# Patient Record
Sex: Female | Born: 1995 | Race: Black or African American | Hispanic: No | Marital: Single | Smoking: Current every day smoker
Health system: Southern US, Community
[De-identification: ages and names within clinical notes are randomized; demographics above are authoritative.]

## PROBLEM LIST (undated history)

## (undated) DIAGNOSIS — J45909 Unspecified asthma, uncomplicated: Secondary | ICD-10-CM

## (undated) DIAGNOSIS — R87619 Unspecified abnormal cytological findings in specimens from cervix uteri: Secondary | ICD-10-CM

## (undated) HISTORY — DX: Unspecified abnormal cytological findings in specimens from cervix uteri: R87.619

---
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
## 2014-01-01 LAB — URINE CULTURE: Colony Count: >=100000

## 2014-01-02 ENCOUNTER — Telehealth (HOSPITAL_BASED_OUTPATIENT_CLINIC_OR_DEPARTMENT_OTHER): Payer: Self-pay

## 2014-01-02 NOTE — Progress Notes (Signed)
ED Antimicrobial Stewardship Positive Culture Follow Up   Mindi JunkerBrittany A Schwark is an 18 y.o. female who [facility] with a chief complaint of  Chief Complaint  Patient presents with  . Back Pain    Recent Results (from the past 720 hour(s))  URINE CULTURE     Status: None   Collection Time    12/28/13  6:20 AM      Result Value Ref Range Status   Specimen Description URINE, CLEAN CATCH   Final   Special Requests None   Final   Culture  Setup Time     Final   Value: 12/28/2013 08:02     Performed at [facility]   Colony Count     Final   Value: >=100,000 COLONIES/ML     Performed at [facility]   Culture     Final   Value: STAPHYLOCOCCUS SPECIES (COAGULASE NEGATIVE)     Note: RIFAMPIN AND GENTAMICIN SHOULD NOT BE USED AS SINGLE DRUGS FOR TREATMENT OF STAPH INFECTIONS.     Performed at [facility]   Report Status 01/01/2014 FINAL   Final   Organism ID, Bacteria STAPHYLOCOCCUS SPECIES (COAGULASE NEGATIVE)   Final    [x]  Treated with cephalexin, organism resistant to prescribed antimicrobial []  Patient discharged originally without antimicrobial agent and treatment is now indicated  New antibiotic prescription: macrobid 100mg  po BID x 5 days  ED Provider: Emilia BeckKaitlyn Szekalski PA-C   Garth BignessJeremy [facility] 01/02/2014, 10:37 AM Infectious Diseases Pharmacist Phone# [phone]

## 2014-01-02 NOTE — Telephone Encounter (Unsigned)
Post ED Visit - Positive Culture Follow-up: Successful Patient Follow-Up  Culture assessed and recommendations reviewed by: []  Wes Dulaney, Pharm.D., BCPS [x]  Celedonio MiyamotoJeremy Frens, Pharm.D., BCPS []  Georgina PillionElizabeth Martin, Pharm.D., BCPS []  DamascusMinh Pham, [address] Rainbow BoulevardPharm.D., BCPS, AAHIVP []  Estella HuskMichelle Turner, Pharm.D., BCPS, AAHIVP  Positive Urine culture, >/= 100,000 colonies -> Staph. species  []  Patient discharged without antimicrobial prescription and treatment is now indicated [x]  Organism is resistant to prescribed ED discharge antimicrobial, Cephalexin []  Patient with positive blood cultures  Changes discussed with ED provider: Everlean PattersonK. Szekalski PA New antibiotic prescription "Macrobid 100 mg po BID x 5 days"Called to *** Contacted patient, date 01/02/14, time 11:58 [facility] [phone]   Arvid Rightatricia Dorn Jeffery Bachmeier 01/02/2014, 12:00 PM

## 2014-06-25 ENCOUNTER — Encounter (HOSPITAL_COMMUNITY): Payer: Self-pay | Admitting: Emergency Medicine

## 2018-08-20 ENCOUNTER — Encounter (HOSPITAL_COMMUNITY): Payer: Self-pay

## 2018-08-20 ENCOUNTER — Ambulatory Visit (HOSPITAL_COMMUNITY)
Admission: EM | Admit: 2018-08-20 | Discharge: 2018-08-20 | Disposition: A | Discharge status: Home / Self Care | Payer: Medicaid Other | Attending: Family Medicine | Admitting: Family Medicine

## 2018-08-20 ENCOUNTER — Other Ambulatory Visit: Payer: Self-pay

## 2018-08-20 DIAGNOSIS — J309 Allergic rhinitis, unspecified: Secondary | ICD-10-CM | POA: Insufficient documentation

## 2018-08-20 DIAGNOSIS — J45909 Unspecified asthma, uncomplicated: Secondary | ICD-10-CM | POA: Insufficient documentation

## 2018-08-20 DIAGNOSIS — R0981 Nasal congestion: Secondary | ICD-10-CM | POA: Insufficient documentation

## 2018-08-20 MED ORDER — MONTELUKAST SODIUM 10 MG PO TABS: 10.0000 mg | ORAL_TABLET | Freq: Every day | ORAL | 0 refills | Status: DC

## 2018-08-20 MED ORDER — CETIRIZINE HCL 10 MG PO TABS: 10.0000 mg | ORAL_TABLET | Freq: Every day | ORAL | 0 refills | Status: DC

## 2018-08-20 MED ORDER — ALBUTEROL SULFATE HFA 108 (90 BASE) MCG/ACT IN AERS: 2.0000 | INHALATION_SPRAY | Freq: Four times a day (QID) | RESPIRATORY_TRACT | 0 refills | Status: AC | PRN

## 2018-08-20 NOTE — ED Provider Notes (Signed)
[facility]    CSN: [id number] Arrival date & time: 08/20/18  1412     History   Chief Complaint Chief Complaint  Patient presents with  . Nasal Congestion    HPI Sara Gill is a 22 y.o. female.  Nasal congestion: C/O nasal congestion for 3-4 days, associated with reduced taste sensation. No fever but she felt warm yesterday. Stuffy and runny nose, the color of her nasal discharge is yellow or clear, no blood in it. She endorsed postnasal drip which irritates her throat. She coughs some. She works at [employer] in the [employer] department. She had not been to work in 2 days. Asthma: She stated that she has been wheezing a lot more lately and had been using her inhaler more in the last two days. She requested refill of her med.  The history is provided by the patient.    Past Medical History:  Diagnosis Date  . Asthma     There are no active problems to display for this patient.   History reviewed. No pertinent surgical history.  OB History    Gravida  1   Para      Term      Preterm      AB      Living        SAB      TAB      Ectopic      Multiple      Live Births               Home Medications    Prior to Admission medications   Medication Sig Start Date End Date Taking? Authorizing Provider  acetaminophen (TYLENOL) 500 MG tablet Take 1,000 mg by mouth every 6 (six) hours as needed for moderate pain.    [provider]  albuterol (PROVENTIL HFA;VENTOLIN HFA) 108 (90 BASE) MCG/ACT inhaler Inhale 2 puffs into the lungs every 6 (six) hours as needed. Shortness of breath/wheezing    [provider]  albuterol (PROVENTIL) (2.5 MG/3ML) 0.083% nebulizer solution Take 2.5 mg by nebulization every 6 (six) hours as needed. Shortness of breath/wheezing    [provider]  cephALEXin (KEFLEX) 500 MG capsule Take 1 capsule (500 mg total) by mouth 4 (four) times daily. 12/28/13   Renne CriglerGeiple, Joshua, PA-C  Prenatal Vit-Fe  Fumarate-FA (PRENATAL MULTIVITAMIN) TABS tablet Take 1 tablet by mouth daily at 12 noon.    [provider]    Family History History reviewed. No pertinent family history.  Social History Social History   Tobacco Use  . Smoking status: Never Smoker  . Smokeless tobacco: Never Used  Substance Use Topics  . Alcohol use: No  . Drug use: No     Allergies   Patient has no known allergies.   Review of Systems Review of Systems  HENT: Positive for congestion, postnasal drip and rhinorrhea. Negative for ear discharge, ear pain, nosebleeds, sinus pressure, sinus pain, sneezing and trouble swallowing.   Respiratory: Negative.   Cardiovascular: Negative.   Gastrointestinal: Negative.   Neurological: Negative.   All other systems reviewed and are negative.    Physical Exam Triage Vital Signs ED Triage Vitals  Enc Vitals Group     BP 08/20/18 1458 139/62     Pulse Rate 08/20/18 1458 64     Resp 08/20/18 1458 18     Temp 08/20/18 1458 97.8 F (36.6 C)     Temp src --  SpO2 08/20/18 1458 99 %     Weight 08/20/18 1455 180 lb (81.6 kg)     Height --      Head Circumference --      Peak Flow --      Pain Score 08/20/18 1455 6     Pain Loc --      Pain Edu? --      Excl. in GC? --    No data found.  Updated Vital Signs BP 139/62 (BP Location: Right Arm)   Pulse 64   Temp 97.8 F (36.6 C)   Resp 18   Wt 81.6 kg   LMP 08/18/2018   SpO2 99%   BMI 28.19 kg/m   Visual Acuity Right Eye Distance:   Left Eye Distance:   Bilateral Distance:    Right Eye Near:   Left Eye Near:    Bilateral Near:     Physical Exam Vitals signs and nursing note reviewed.  Constitutional:      General: She is not in acute distress.    Appearance: Normal appearance. She is not ill-appearing.  HENT:     Head: Normocephalic.     Right Ear: Tympanic membrane, ear canal and external ear normal.     Left Ear: Tympanic membrane, ear canal and external ear normal.     Nose:  Congestion present.     Mouth/Throat:     Mouth: Mucous membranes are moist.     Pharynx: No oropharyngeal exudate or posterior oropharyngeal erythema.  Eyes:     General:        Right eye: No discharge.        Left eye: No discharge.     Conjunctiva/sclera: Conjunctivae normal.     Pupils: Pupils are equal, round, and reactive to light.  Neck:     Musculoskeletal: Normal range of motion and neck supple.  Cardiovascular:     Rate and Rhythm: Normal rate and regular rhythm.     Pulses: Normal pulses.     Heart sounds: No murmur.  Pulmonary:     Effort: Pulmonary effort is normal. No respiratory distress.     Breath sounds: Normal breath sounds. No wheezing or rhonchi.  Abdominal:     General: Abdomen is flat. Bowel sounds are normal. There is no distension.     Palpations: There is no mass.     Tenderness: There is no abdominal tenderness.  Neurological:     Mental Status: She is alert.      UC Treatments / Results  Labs (all labs ordered are listed, but only abnormal results are displayed) Labs Reviewed - No data to display  EKG None  Radiology No results found.  Procedures Procedures (including critical care time)  Medications Ordered in UC Medications - No data to display  Initial Impression / Assessment and Plan / UC Course  I have reviewed the triage vital signs and the nursing notes.  Pertinent labs & imaging results that were available during my care of the patient were reviewed by me and considered in my medical decision making (see chart for details).  Clinical Course as of Aug 20 1518  Sat Aug 20, 2018  1517 Allergic rhinitis. No signs of bacterial infection. Start Zyrtec and Singulair. Take Tylenol as needed for pain. Return precaution discussed. Work Contractor.   [KE]  1518 No acute asthma exacerbation. Continue home albuterol as needed. Medication refilled. See PCP soon if symptoms persists or return to the ED if it  worsen. O2 Sat on RA  normal. Stable for d/c home.   [KE]    Clinical Course User Index [KE] Doreene ElandEniola, Culver Feighner T, MD    Nasal congestion  Allergic rhinitis, unspecified seasonality, unspecified trigger  Uncomplicated asthma, unspecified asthma severity, unspecified whether persistent   Final Clinical Impressions(s) / UC Diagnoses   Final diagnoses:  None   Discharge Instructions   None    ED Prescriptions    None     Controlled Substance Prescriptions [place] Controlled Substance Registry consulted? Not Applicable   Doreene ElandEniola, Jaaziah Schulke T, MD 08/20/18 [phone]

## 2018-08-20 NOTE — ED Triage Notes (Signed)
Pt cc nasal congestion x 4 days or more.

## 2018-08-20 NOTE — Discharge Instructions (Addendum)
It was nice seeing you today. Looks like you have allergic rhinitis. Please use allergy medications as instructed. I also refilled your asthma med. See us soon if your symptoms worsen.

## 2018-11-24 ENCOUNTER — Other Ambulatory Visit: Payer: Self-pay

## 2018-11-24 ENCOUNTER — Ambulatory Visit (HOSPITAL_COMMUNITY)
Admission: EM | Admit: 2018-11-24 | Discharge: 2018-11-24 | Disposition: A | Discharge status: Home / Self Care | Payer: Medicaid Other | Attending: Family Medicine | Admitting: Family Medicine

## 2018-11-24 ENCOUNTER — Encounter (HOSPITAL_COMMUNITY): Payer: Self-pay

## 2018-11-24 DIAGNOSIS — Z202 Contact with and (suspected) exposure to infections with a predominantly sexual mode of transmission: Secondary | ICD-10-CM | POA: Diagnosis not present

## 2018-11-24 DIAGNOSIS — Z113 Encounter for screening for infections with a predominantly sexual mode of transmission: Secondary | ICD-10-CM | POA: Diagnosis not present

## 2018-11-24 MED ORDER — CEFTRIAXONE SODIUM 250 MG IJ SOLR
250.0000 mg | Freq: Once | INTRAMUSCULAR | Status: AC
  Administered 2018-11-24: 250 mg via INTRAMUSCULAR

## 2018-11-24 MED ORDER — AZITHROMYCIN 250 MG PO TABS
ORAL_TABLET | ORAL | Status: AC
  Filled 2018-11-24: qty 4

## 2018-11-24 MED ORDER — CEFTRIAXONE SODIUM 250 MG IJ SOLR
INTRAMUSCULAR | Status: AC
  Filled 2018-11-24: qty 250

## 2018-11-24 MED ORDER — AZITHROMYCIN 250 MG PO TABS
1000.0000 mg | ORAL_TABLET | Freq: Once | ORAL | Status: AC
  Administered 2018-11-24: 11:00:00 1000 mg via ORAL

## 2018-11-24 NOTE — ED Provider Notes (Addendum)
[facility]    CSN: [id number] Arrival date & time: 11/24/18  1002     History   Chief Complaint Chief Complaint  Patient presents with  . SEXUALLY TRANSMITTED DISEASE    HPI Sara Gill is a 23 y.o. female.   Patient is a 23 year old female who presents today for STD screening.  Reports that her sexual partner had unprotected sex with another partner that was known to have chlamydia.  She is currently not having any symptoms.  The problem has been constant.  She has been worried for the past couple days.  ROS per HPI      Past Medical History:  Diagnosis Date  . Asthma     There are no active problems to display for this patient.   History reviewed. No pertinent surgical history.  OB History    Gravida  1   Para      Term      Preterm      AB      Living        SAB      TAB      Ectopic      Multiple      Live Births               Home Medications    Prior to Admission medications   Medication Sig Start Date End Date Taking? Authorizing Provider  acetaminophen (TYLENOL) 500 MG tablet Take 1,000 mg by mouth every 6 (six) hours as needed for moderate pain.    [provider]  albuterol (PROVENTIL HFA;VENTOLIN HFA) 108 (90 Base) MCG/ACT inhaler Inhale 2 puffs into the lungs every 6 (six) hours as needed for wheezing or shortness of breath. Shortness of breath/wheezing 08/20/18   Janit Pagan T, MD  albuterol (PROVENTIL) (2.5 MG/3ML) 0.083% nebulizer solution Take 2.5 mg by nebulization every 6 (six) hours as needed. Shortness of breath/wheezing    [provider]  cephALEXin (KEFLEX) 500 MG capsule Take 1 capsule (500 mg total) by mouth 4 (four) times daily. 12/28/13   Renne Crigler, PA-C  cetirizine (ZYRTEC) 10 MG tablet Take 1 tablet (10 mg total) by mouth daily. 08/20/18   Doreene Eland, MD  montelukast (SINGULAIR) 10 MG tablet Take 1 tablet (10 mg total) by mouth at bedtime. 08/20/18   Doreene Eland, MD  Prenatal Vit-Fe Fumarate-FA (PRENATAL MULTIVITAMIN) TABS tablet Take 1 tablet by mouth daily at 12 noon.    [provider]    Family History Family History  Problem Relation Age of Onset  . Healthy Mother   . Healthy Father     Social History Social History   Tobacco Use  . Smoking status: Never Smoker  . Smokeless tobacco: Never Used  Substance Use Topics  . Alcohol use: No  . Drug use: No     Allergies   Patient has no known allergies.   Review of Systems Review of Systems  Genitourinary: Negative for decreased urine volume, difficulty urinating, dyspareunia, dysuria, enuresis, flank pain, frequency, genital sores, hematuria, menstrual problem, pelvic pain, urgency, vaginal bleeding, vaginal discharge and vaginal pain.     Physical Exam Triage Vital Signs ED Triage Vitals  Enc Vitals Group     BP 11/24/18 1020 117/67     Pulse Rate 11/24/18 1020 84     Resp 11/24/18 1020 18     Temp 11/24/18 1020 97.7 F (36.5 C)     Temp src --  SpO2 11/24/18 1020 100 %     Weight 11/24/18 1018 180 lb (81.6 kg)     Height --      Head Circumference --      Peak Flow --      Pain Score --      Pain Loc --      Pain Edu? --      Excl. in GC? --    No data found.  Updated Vital Signs BP 117/67 (BP Location: Left Arm)   Pulse 84   Temp 97.7 F (36.5 C)   Resp 18   Wt 180 lb (81.6 kg)   LMP 11/15/2018   SpO2 100%   BMI 28.19 kg/m   Visual Acuity Right Eye Distance:   Left Eye Distance:   Bilateral Distance:    Right Eye Near:   Left Eye Near:    Bilateral Near:     Physical Exam Vitals signs and nursing note reviewed.  Constitutional:      General: She is not in acute distress.    Appearance: Normal appearance. She is not ill-appearing, toxic-appearing or diaphoretic.  HENT:     Head: Normocephalic.     Nose: Nose normal.     Mouth/Throat:     Pharynx: Oropharynx is clear.  Eyes:     Conjunctiva/sclera: Conjunctivae  normal.  Neck:     Musculoskeletal: Normal range of motion.  Pulmonary:     Effort: Pulmonary effort is normal.  Abdominal:     Palpations: Abdomen is soft.     Tenderness: There is no abdominal tenderness.  Musculoskeletal: Normal range of motion.  Skin:    General: Skin is warm and dry.     Findings: No rash.  Neurological:     Mental Status: She is alert.  Psychiatric:        Mood and Affect: Mood normal.      UC Treatments / Results  Labs (all labs ordered are listed, but only abnormal results are displayed) Labs Reviewed  CERVICOVAGINAL ANCILLARY ONLY    EKG None  Radiology No results found.  Procedures Procedures (including critical care time)  Medications Ordered in UC Medications  cefTRIAXone (ROCEPHIN) injection 250 mg (has no administration in time range)  azithromycin (ZITHROMAX) tablet 1,000 mg (has no administration in time range)    Initial Impression / Assessment and Plan / UC Course  I have reviewed the triage vital signs and the nursing notes.  Pertinent labs & imaging results that were available during my care of the patient were reviewed by me and considered in my medical decision making (see chart for details).     Screening for STDs Swab sent for testing We will treat today prophylactically for gonorrhea and chlamydia based on exposure Labs pending.  Final Clinical Impressions(s) / UC Diagnoses   Final diagnoses:  Possible exposure to STD     Discharge Instructions     We are treating you today for possible exposure to STDs Your labs are sent for testing with results pending You can check your MyChart for results    ED Prescriptions    None     Controlled Substance Prescriptions [place] Controlled Substance Registry consulted? Not Applicable        Janace Aris, NP 11/24/18 [phone]

## 2018-11-24 NOTE — ED Notes (Signed)
Patient verbalizes understanding of discharge instructions. Opportunity for questioning and answers were provided. Patient discharged from [facility] by RN.  

## 2018-11-24 NOTE — ED Triage Notes (Signed)
Pt states she wants to get tested for STDs. Pt was told that her boyfriend had been messing around.

## 2018-11-24 NOTE — Discharge Instructions (Addendum)
We are treating you today for possible exposure to STDs Your labs are sent for testing with results pending You can check your MyChart for results

## 2018-11-25 ENCOUNTER — Telehealth (HOSPITAL_COMMUNITY): Payer: Self-pay | Admitting: Emergency Medicine

## 2018-11-25 LAB — CERVICOVAGINAL ANCILLARY ONLY
Chlamydia: POSITIVE — AB
Neisseria Gonorrhea: NEGATIVE
Trichomonas: POSITIVE — AB

## 2018-11-25 MED ORDER — METRONIDAZOLE 500 MG PO TABS: 500.0000 mg | ORAL_TABLET | Freq: Two times a day (BID) | ORAL | 0 refills | Status: AC

## 2018-11-25 NOTE — Telephone Encounter (Signed)
Trichomonas is positive. Rx  for Flagyl 2 grams, once was sent to the pharmacy of record. Pt needs education to refrain from sexual intercourse for 7 days to give the medicine time to work. Sexual partners need to be notified and tested/treated. Condoms may reduce risk of reinfection. Recheck for further evaluation if symptoms are not improving.   Chlamydia is positive.  This was treated at the urgent care visit with po zithromax 1g.  Pt needs education to please refrain from sexual intercourse for 7 days to give the medicine time to work.  Sexual partners need to be notified and tested/treated.  Condoms may reduce risk of reinfection.  Recheck or followup with PCP for further evaluation if symptoms are not improving.  [facility] notified.  Patient contacted and made aware of all results, all questions answered.

## 2018-12-28 ENCOUNTER — Ambulatory Visit (HOSPITAL_COMMUNITY)
Admission: EM | Admit: 2018-12-28 | Discharge: 2018-12-28 | Disposition: A | Discharge status: Home / Self Care | Payer: Medicaid Other | Attending: Family Medicine | Admitting: Family Medicine

## 2018-12-28 ENCOUNTER — Other Ambulatory Visit: Payer: Self-pay

## 2018-12-28 ENCOUNTER — Ambulatory Visit (INDEPENDENT_AMBULATORY_CARE_PROVIDER_SITE_OTHER): Payer: Medicaid Other

## 2018-12-28 ENCOUNTER — Encounter (HOSPITAL_COMMUNITY): Payer: Self-pay | Admitting: Emergency Medicine

## 2018-12-28 DIAGNOSIS — S6992XA Unspecified injury of left wrist, hand and finger(s), initial encounter: Secondary | ICD-10-CM

## 2018-12-28 DIAGNOSIS — W228XXA Striking against or struck by other objects, initial encounter: Secondary | ICD-10-CM

## 2018-12-28 DIAGNOSIS — M79642 Pain in left hand: Secondary | ICD-10-CM | POA: Diagnosis not present

## 2018-12-28 DIAGNOSIS — M7989 Other specified soft tissue disorders: Secondary | ICD-10-CM | POA: Diagnosis not present

## 2018-12-28 NOTE — ED Notes (Signed)
Patient able to ambulate independently  

## 2018-12-28 NOTE — ED Triage Notes (Signed)
Pt here for left thumb and wrist pain after injuring today

## 2018-12-28 NOTE — ED Provider Notes (Signed)
  [facility]   [id number] 12/28/18 Arrival Time: 1208  ASSESSMENT & PLAN:  1. Hand injury, left, initial encounter    Patient decided to leave before I was able to discuss her x-ray results with her. Informed RN. Unsure if she will return.  I have personally viewed the imaging studies ordered this visit. Imaging: No hand/thumb fracture.   SUBJECTIVE: History from: patient. Sara Gill is a 23 y.o. female who reports persistent mild to moderate pain of her left hand/thumb; described as aching without radiation. Onset: abrupt, this morning. Injury/trama: questions "hitting my hand on something; not sure what". Slight swelling noticed. Symptoms have progressed to a point and plateaued since beginning. Aggravating factors: movement. Alleviating factors: rest. Associated symptoms: none reported. Extremity sensation changes or weakness: none. Self treatment: has not tried OTCs for relief of pain. History of similar: no.  History reviewed. No pertinent surgical history.   ROS: As per HPI.   OBJECTIVE:  Vitals:   12/28/18 1242  BP: 125/76  Pulse: 89  Resp: 18  Temp: 98.2 F (36.8 C)  TempSrc: Oral  SpO2: 100%    General appearance: alert; no distress Extremities: . LUE: warm and well perfused; fairly well localized mild tenderness over left thenar area; without gross deformities; with mild swelling; with no bruising; ROM: normal wrist and fingers with reported discomfort when moving thumb CV: brisk extremity capillary refill of LUE; 2+ radial pulse of LUE. Skin: warm and dry; no visible rashes Neurologic: gait normal; normal reflexes of LUE; normal sensation of LUE; normal strength of LUE Psychological: alert and cooperative; normal mood and affect  No Known Allergies  Past Medical History:  Diagnosis Date  . Asthma    Social History   Socioeconomic History  . Marital status: Single    Spouse name: Not on file  . Number of children: Not on file   . Years of education: Not on file  . Highest education level: Not on file  Occupational History  . Not on file  Social Needs  . Financial resource strain: Not on file  . Food insecurity:    Worry: Not on file    Inability: Not on file  . Transportation needs:    Medical: Not on file    Non-medical: Not on file  Tobacco Use  . Smoking status: Never Smoker  . Smokeless tobacco: Never Used  Substance and Sexual Activity  . Alcohol use: No  . Drug use: No  . Sexual activity: Yes    Birth control/protection: None  Lifestyle  . Physical activity:    Days per week: Not on file    Minutes per session: Not on file  . Stress: Not on file  Relationships  . Social connections:    Talks on phone: Not on file    Gets together: Not on file    Attends religious service: Not on file    Active member of club or organization: Not on file    Attends meetings of clubs or organizations: Not on file    Relationship status: Not on file  Other Topics Concern  . Not on file  Social History Narrative  . Not on file   Family History  Problem Relation Age of Onset  . Healthy Mother   . Healthy Father    History reviewed. No pertinent surgical history.    Mardella Layman, MD 12/28/18 1345

## 2019-02-28 ENCOUNTER — Emergency Department (HOSPITAL_COMMUNITY): Payer: Medicaid Other

## 2019-02-28 ENCOUNTER — Encounter (HOSPITAL_COMMUNITY): Payer: Self-pay

## 2019-02-28 ENCOUNTER — Emergency Department (HOSPITAL_COMMUNITY)
Admission: EM | Admit: 2019-02-28 | Discharge: 2019-02-28 | Disposition: A | Discharge status: Home / Self Care | Payer: Medicaid Other | Attending: Emergency Medicine | Admitting: Emergency Medicine

## 2019-02-28 DIAGNOSIS — Z23 Encounter for immunization: Secondary | ICD-10-CM | POA: Diagnosis not present

## 2019-02-28 DIAGNOSIS — T07XXXA Unspecified multiple injuries, initial encounter: Secondary | ICD-10-CM | POA: Diagnosis not present

## 2019-02-28 DIAGNOSIS — F1290 Cannabis use, unspecified, uncomplicated: Secondary | ICD-10-CM | POA: Insufficient documentation

## 2019-02-28 DIAGNOSIS — Y929 Unspecified place or not applicable: Secondary | ICD-10-CM | POA: Diagnosis not present

## 2019-02-28 DIAGNOSIS — Y999 Unspecified external cause status: Secondary | ICD-10-CM | POA: Diagnosis not present

## 2019-02-28 DIAGNOSIS — S31109A Unspecified open wound of abdominal wall, unspecified quadrant without penetration into peritoneal cavity, initial encounter: Secondary | ICD-10-CM | POA: Diagnosis not present

## 2019-02-28 DIAGNOSIS — Y939 Activity, unspecified: Secondary | ICD-10-CM | POA: Diagnosis not present

## 2019-02-28 DIAGNOSIS — S31823A Puncture wound without foreign body of left buttock, initial encounter: Secondary | ICD-10-CM

## 2019-02-28 DIAGNOSIS — R42 Dizziness and giddiness: Secondary | ICD-10-CM | POA: Diagnosis not present

## 2019-02-28 DIAGNOSIS — W3400XA Accidental discharge from unspecified firearms or gun, initial encounter: Secondary | ICD-10-CM | POA: Insufficient documentation

## 2019-02-28 DIAGNOSIS — D509 Iron deficiency anemia, unspecified: Secondary | ICD-10-CM

## 2019-02-28 DIAGNOSIS — F17210 Nicotine dependence, cigarettes, uncomplicated: Secondary | ICD-10-CM | POA: Diagnosis not present

## 2019-02-28 DIAGNOSIS — R58 Hemorrhage, not elsewhere classified: Secondary | ICD-10-CM | POA: Diagnosis not present

## 2019-02-28 DIAGNOSIS — S41142A Puncture wound with foreign body of left upper arm, initial encounter: Secondary | ICD-10-CM | POA: Diagnosis not present

## 2019-02-28 DIAGNOSIS — R52 Pain, unspecified: Secondary | ICD-10-CM | POA: Diagnosis not present

## 2019-02-28 DIAGNOSIS — S31000A Unspecified open wound of lower back and pelvis without penetration into retroperitoneum, initial encounter: Secondary | ICD-10-CM | POA: Diagnosis not present

## 2019-02-28 DIAGNOSIS — R000 Tachycardia, unspecified: Secondary | ICD-10-CM | POA: Diagnosis not present

## 2019-02-28 DIAGNOSIS — S31809A Unspecified open wound of unspecified buttock, initial encounter: Secondary | ICD-10-CM | POA: Diagnosis not present

## 2019-02-28 LAB — PREPARE FRESH FROZEN PLASMA
Unit division: 0
Unit division: 0

## 2019-02-28 LAB — TYPE AND SCREEN
ABO/RH(D): B POS
Antibody Screen: NEGATIVE
Unit division: 0
Unit division: 0

## 2019-02-28 LAB — BPAM FFP
Blood Product Expiration Date: 202007072359
Blood Product Expiration Date: 202007072359
ISSUE DATE / TIME: 202007072006
ISSUE DATE / TIME: 202007072006
Unit Type and Rh: 2800
Unit Type and Rh: 8400

## 2019-02-28 LAB — CBC
HCT: 28.6 % — ABNORMAL LOW (ref 36.0–46.0)
Hemoglobin: 7.8 g/dL — ABNORMAL LOW (ref 12.0–15.0)
MCH: 17.8 pg — ABNORMAL LOW (ref 26.0–34.0)
MCHC: 27.3 g/dL — ABNORMAL LOW (ref 30.0–36.0)
MCV: 65.4 fL — ABNORMAL LOW (ref 80.0–100.0)
Platelets: 203 10*3/uL (ref 150–400)
RBC: 4.37 MIL/uL (ref 3.87–5.11)
RDW: 20.4 % — ABNORMAL HIGH (ref 11.5–15.5)
WBC: 9.8 10*3/uL (ref 4.0–10.5)
nRBC: 0 % (ref 0.0–0.2)

## 2019-02-28 LAB — I-STAT CHEM 8, ED
BUN: 5 mg/dL — ABNORMAL LOW (ref 6–20)
Calcium, Ion: 1.06 mmol/L — ABNORMAL LOW (ref 1.15–1.40)
Chloride: 107 mmol/L (ref 98–111)
Creatinine, Ser: 0.8 mg/dL (ref 0.44–1.00)
Glucose, Bld: 122 mg/dL — ABNORMAL HIGH (ref 70–99)
HCT: 30 % — ABNORMAL LOW (ref 36.0–46.0)
Hemoglobin: 10.2 g/dL — ABNORMAL LOW (ref 12.0–15.0)
Potassium: 3.4 mmol/L — ABNORMAL LOW (ref 3.5–5.1)
Sodium: 139 mmol/L (ref 135–145)
TCO2: 19 mmol/L — ABNORMAL LOW (ref 22–32)

## 2019-02-28 LAB — BPAM RBC
Blood Product Expiration Date: 202007132359
Blood Product Expiration Date: 202007142359
ISSUE DATE / TIME: 202007072006
ISSUE DATE / TIME: 202007072006
Unit Type and Rh: 9500
Unit Type and Rh: 9500

## 2019-02-28 LAB — CDS SEROLOGY

## 2019-02-28 LAB — I-STAT BETA HCG BLOOD, ED (MC, WL, AP ONLY): I-stat hCG, quantitative: <5 m[IU]/mL (ref <5)

## 2019-02-28 LAB — ABO/RH: ABO/RH(D): B POS

## 2019-02-28 MED ORDER — FENTANYL CITRATE (PF) 100 MCG/2ML IJ SOLN
INTRAMUSCULAR | Status: AC
  Filled 2019-02-28: qty 2

## 2019-02-28 MED ORDER — FENTANYL CITRATE (PF) 100 MCG/2ML IJ SOLN
INTRAMUSCULAR | Status: AC | PRN
  Administered 2019-02-28: 50 ug via INTRAVENOUS

## 2019-02-28 MED ORDER — HYDROCODONE-ACETAMINOPHEN 5-325 MG PO TABS: 1.0000 | ORAL_TABLET | Freq: Four times a day (QID) | ORAL | 0 refills | Status: DC | PRN

## 2019-02-28 MED ORDER — IOHEXOL 300 MG/ML  SOLN
100.0000 mL | Freq: Once | INTRAMUSCULAR | Status: AC | PRN
  Administered 2019-02-28: 100 mL via INTRAVENOUS

## 2019-02-28 MED ORDER — OXYCODONE-ACETAMINOPHEN 5-325 MG PO TABS
1.0000 | ORAL_TABLET | Freq: Once | ORAL | Status: AC
  Administered 2019-02-28: 1 via ORAL
  Filled 2019-02-28: qty 1

## 2019-02-28 MED ORDER — TETANUS-DIPHTH-ACELL PERTUSSIS 5-2.5-18.5 LF-MCG/0.5 IM SUSP
0.5000 mL | Freq: Once | INTRAMUSCULAR | Status: AC
  Administered 2019-02-28: 0.5 mL via INTRAMUSCULAR
  Filled 2019-02-28: qty 0.5

## 2019-02-28 MED ORDER — CEPHALEXIN 500 MG PO CAPS: 500.0000 mg | ORAL_CAPSULE | Freq: Four times a day (QID) | ORAL | 0 refills | Status: DC

## 2019-02-28 NOTE — Discharge Instructions (Addendum)
Follow-up with a primary care doctor for further evaluation of your anemia.
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
## 2019-03-01 ENCOUNTER — Encounter (HOSPITAL_COMMUNITY): Payer: Self-pay | Admitting: Emergency Medicine
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
## 2021-10-01 ENCOUNTER — Encounter (HOSPITAL_COMMUNITY): Payer: Self-pay | Admitting: *Deleted

## 2021-10-01 ENCOUNTER — Emergency Department (HOSPITAL_COMMUNITY)
Admission: EM | Admit: 2021-10-01 | Discharge: 2021-10-01 | Disposition: A | Discharge status: Home / Self Care | Payer: Medicaid Other | Attending: Emergency Medicine | Admitting: Emergency Medicine

## 2021-10-01 ENCOUNTER — Other Ambulatory Visit: Payer: Self-pay

## 2021-10-01 DIAGNOSIS — L0231 Cutaneous abscess of buttock: Secondary | ICD-10-CM | POA: Diagnosis present

## 2021-10-01 DIAGNOSIS — L0501 Pilonidal cyst with abscess: Secondary | ICD-10-CM | POA: Diagnosis not present

## 2021-10-01 MED ORDER — PENTAFLUOROPROP-TETRAFLUOROETH EX AERO: INHALATION_SPRAY | CUTANEOUS | Status: DC | PRN

## 2021-10-01 MED ORDER — POVIDONE-IODINE 10 % EX SOLN
CUTANEOUS | Status: DC | PRN
  Filled 2021-10-01: qty 14.8

## 2021-10-01 MED ORDER — CEPHALEXIN 500 MG PO CAPS: 500.0000 mg | ORAL_CAPSULE | Freq: Four times a day (QID) | ORAL | 0 refills | Status: AC

## 2021-10-01 NOTE — Discharge Instructions (Signed)
Complete the entire course of the antibiotics prescribed.  Sit in no warm Epsom salt tub of water for 10 to 15 minutes twice daily for the next 3 to 4 days as this infection continues to heal.  Get rechecked for any worsening symptoms.

## 2021-10-01 NOTE — ED Provider Notes (Signed)
[facility] EMERGENCY DEPARTMENT Provider Note   CSN: [id number] Arrival date & time: 10/01/21  1349     History  Chief Complaint  Patient presents with   Abscess    Sara Gill is a 26 y.o. female presenting for evaluation of a painful abscess which is developed over the past several days at her right upper buttock region, describing having an abscess at the same site over 1 year ago.  Denies abscesses at any other sites.  Denies fevers or chills.  She has been using warm compresses and states that since she arrived there has been a small amount of purulence draining but still has significant pain and swelling.  She has not had an I&D in the past at the site stating it is always gone away on its own using warm compresses.  The history is provided by the patient.      Home Medications Prior to Admission medications   Medication Sig Start Date End Date Taking? Authorizing Provider  cephALEXin (KEFLEX) 500 MG capsule Take 1 capsule (500 mg total) by mouth 4 (four) times daily for 7 days. 10/01/21 10/08/21 Yes Ladasha Schnackenberg, Almyra Free, PA-C  acetaminophen (TYLENOL) 500 MG tablet Take 1,000 mg by mouth every 6 (six) hours as needed for moderate pain.    [provider]  albuterol (PROVENTIL HFA;VENTOLIN HFA) 108 (90 Base) MCG/ACT inhaler Inhale 2 puffs into the lungs every 6 (six) hours as needed for wheezing or shortness of breath. Shortness of breath/wheezing 08/20/18   Andrena Mews T, MD  albuterol (PROVENTIL) (2.5 MG/3ML) 0.083% nebulizer solution Take 2.5 mg by nebulization every 6 (six) hours as needed. Shortness of breath/wheezing    [provider]  cetirizine (ZYRTEC) 10 MG tablet Take 1 tablet (10 mg total) by mouth daily. 08/20/18   Kinnie Feil, MD  HYDROcodone-acetaminophen (NORCO/VICODIN) 5-325 MG tablet Take 1-2 tablets by mouth every 6 (six) hours as needed. 02/28/19   Davonna Belling, MD  montelukast (SINGULAIR) 10 MG tablet Take 1 tablet (10 mg total) by  mouth at bedtime. 08/20/18   Kinnie Feil, MD  Prenatal Vit-Fe Fumarate-FA (PRENATAL MULTIVITAMIN) TABS tablet Take 1 tablet by mouth daily at 12 noon.    [provider]      Allergies    Patient has no known allergies.    Review of Systems   Review of Systems  Constitutional:  Negative for chills and fever.  HENT:  Negative for congestion and sore throat.   Eyes: Negative.   Respiratory:  Negative for chest tightness and shortness of breath.   Cardiovascular:  Negative for chest pain.  Gastrointestinal:  Negative for abdominal pain, nausea and vomiting.  Genitourinary: Negative.   Musculoskeletal:  Negative for arthralgias, joint swelling and neck pain.  Skin: Negative.  Negative for rash and wound.       Negative except as mentioned in HPI.    Neurological:  Negative for dizziness, weakness, light-headedness, numbness and headaches.  Psychiatric/Behavioral: Negative.    All other systems reviewed and are negative.  Physical Exam Updated Vital Signs BP 125/88    Pulse 80    Temp 98.2 F (36.8 C) (Oral)    Resp 17    Ht 5\' 9"  (1.753 m)    Wt 99.8 kg    LMP 09/26/2021    SpO2 99%    BMI 32.49 kg/m  Physical Exam Constitutional:      Appearance: She is well-developed.  HENT:     Head: Normocephalic.  Cardiovascular:     Rate and Rhythm: Normal rate.  Pulmonary:     Effort: Pulmonary effort is normal.  Skin:    Findings: Abscess and erythema present.     Comments: Round approximate 4 cm raised fluctuant abscess right upper gluteal cleft.  It is essentially closed except for a tiny distended pore that appears to be leaking a trace amount of purulence.  There is no red streaking.  There is no communication with the anus.  Neurological:     General: No focal deficit present.     Mental Status: She is alert and oriented to person, place, and time.     Sensory: No sensory deficit.    ED Results / Procedures / Treatments   Labs (all labs ordered are listed, but  only abnormal results are displayed) Labs Reviewed - No data to display  EKG None  Radiology No results found.  Procedures Procedures    INCISION AND DRAINAGE Performed by: Evalee Jefferson Consent: Verbal consent obtained. Risks and benefits: risks, benefits and alternatives were discussed Type: abscess  Body area: Right upper buttock  Anesthesia: None, patient refused needles and numbing medication.  She was amenable to Gebauer's freeze spray which was employed.  Incision was made with a scalpel.  Local anesthetic: None  Anesthetic total: 0 ml  Complexity: complex Blunt dissection to break up loculations  Drainage: purulent  Drainage amount: Copious  Packing material: 0 Patient tolerance: Patient tolerated the procedure well with no immediate complications.  Abscess site was flushed using normal saline until it flush completely clean.  Patient tolerated this procedure well, she did not feel the incision, had some modest discomfort during the flushing but tolerated it well.    Medications Ordered in ED Medications  povidone-iodine (BETADINE) 10 % external solution ( Topical Given 10/01/21 1840)  pentafluoroprop-tetrafluoroeth (GEBAUERS) aerosol ( Topical Given 10/01/21 1840)    ED Course/ Medical Decision Making/ A&P                           Medical Decision Making Patient with a recurrent pilonidal abscess.  Risk OTC drugs. Prescription drug management. Minor surgery with no identified risk factors. Risk Details: Patient was referred as an outpatient basis for elective surgery of the site as she has had multiple abscesses at this pilonidal region.  She was referred to Dr. Arnoldo Morale for follow-up care.  We discussed home care of this current infection including continued warm soaks, antibiotics were prescribed.  Return precautions were outlined.           Final Clinical Impression(s) / ED Diagnoses Final diagnoses:  Pilonidal abscess    Rx / DC  Orders ED Discharge Orders          Ordered    cephALEXin (KEFLEX) 500 MG capsule  4 times daily        10/01/21 1859              Evalee Jefferson, PA-C 10/01/21 1957    Hayden Rasmussen, MD 10/02/21 [phone]

## 2021-10-01 NOTE — ED Triage Notes (Signed)
Pt with abscess upper mid buttocks for past 2-3 days, hx of same in same place.

## 2021-10-01 NOTE — ED Notes (Signed)
Assisted with I&D of  pilonidal cyst with moderate amount of drainage.

## 2021-10-02 ENCOUNTER — Telehealth: Payer: Self-pay | Admitting: *Deleted

## 2021-10-02 NOTE — Telephone Encounter (Signed)
Transition Care Management Follow-up Telephone Call Date of discharge and from where: 10/01/2021 - [facility] How have you been since you were released from the hospital? "I am okay" Any questions or concerns? No  Items Reviewed: Did the pt receive and understand the discharge instructions provided? Yes  Medications obtained and verified? Yes  Other? No  Any new allergies since your discharge? No  Dietary orders reviewed? No Do you have support at home? Yes    Functional Questionnaire: (I = Independent and D = Dependent) ADLs: I  Bathing/Dressing- I  Meal Prep- I  Eating- I  Maintaining continence- I  Transferring/Ambulation- I  Managing Meds- I  Follow up appointments reviewed:  PCP Hospital f/u appt confirmed? No  - no PCP Specialist Hospital f/u appt confirmed? No   Are transportation arrangements needed? No  If their condition worsens, is the pt aware to call PCP or go to the Emergency Dept.? Yes Was the patient provided with contact information for the PCP's office or ED? Yes Was to pt encouraged to call back with questions or concerns? Yes

## 2021-11-21 ENCOUNTER — Emergency Department (HOSPITAL_BASED_OUTPATIENT_CLINIC_OR_DEPARTMENT_OTHER)
Admission: EM | Admit: 2021-11-21 | Discharge: 2021-11-21 | Disposition: A | Discharge status: Home / Self Care | Payer: Medicaid Other | Attending: Student | Admitting: Student

## 2021-11-21 ENCOUNTER — Encounter (HOSPITAL_BASED_OUTPATIENT_CLINIC_OR_DEPARTMENT_OTHER): Payer: Self-pay

## 2021-11-21 ENCOUNTER — Other Ambulatory Visit: Payer: Self-pay

## 2021-11-21 DIAGNOSIS — L0231 Cutaneous abscess of buttock: Secondary | ICD-10-CM | POA: Insufficient documentation

## 2021-11-21 DIAGNOSIS — L0501 Pilonidal cyst with abscess: Secondary | ICD-10-CM | POA: Diagnosis not present

## 2021-11-21 MED ORDER — LIDOCAINE HCL (PF) 1 % IJ SOLN
5.0000 mL | Freq: Once | INTRAMUSCULAR | Status: AC
  Administered 2021-11-21: 5 mL via INTRADERMAL
  Filled 2021-11-21: qty 5

## 2021-11-21 MED ORDER — DOXYCYCLINE HYCLATE 100 MG PO CAPS: 100.0000 mg | ORAL_CAPSULE | Freq: Two times a day (BID) | ORAL | 0 refills | Status: DC

## 2021-11-21 NOTE — ED Provider Notes (Signed)
?[facility] EMERGENCY DEPARTMENT ?Provider Note ? ? ?CSN: [id number] ?Arrival date & time: 11/21/21  1553 ? ?  ? ?History ? ?Chief Complaint  ?Patient presents with  ? Abscess  ? ? ?Sara Gill is a 26 y.o. female with a past medical history of recurrent abscesses presenting today with an abscess to the right buttocks.  Says that this has been going on for the past week however its not getting better with her over-the-counter cream and warm washcloths.  Denies any fever or chills.  Says it has not been draining.  No problem with defecation.  She has gotten in touch with a general surgeon for these recurrent abscesses however they are unable to see her until June. ? ? ?Home Medications ?Prior to Admission medications   ?Medication Sig Start Date End Date Taking? Authorizing Provider  ?doxycycline (VIBRAMYCIN) 100 MG capsule Take 1 capsule (100 mg total) by mouth 2 (two) times daily. 11/21/21  Yes Kaelob Persky A, PA-C  ?acetaminophen (TYLENOL) 500 MG tablet Take 1,000 mg by mouth every 6 (six) hours as needed for moderate pain.    [provider]  ?albuterol (PROVENTIL HFA;VENTOLIN HFA) 108 (90 Base) MCG/ACT inhaler Inhale 2 puffs into the lungs every 6 (six) hours as needed for wheezing or shortness of breath. Shortness of breath/wheezing 08/20/18   Janit Pagan T, MD  ?albuterol (PROVENTIL) (2.5 MG/3ML) 0.083% nebulizer solution Take 2.5 mg by nebulization every 6 (six) hours as needed. Shortness of breath/wheezing    [provider]  ?cetirizine (ZYRTEC) 10 MG tablet Take 1 tablet (10 mg total) by mouth daily. 08/20/18   Doreene Eland, MD  ?HYDROcodone-acetaminophen (NORCO/VICODIN) 5-325 MG tablet Take 1-2 tablets by mouth every 6 (six) hours as needed. 02/28/19   Benjiman Core, MD  ?montelukast (SINGULAIR) 10 MG tablet Take 1 tablet (10 mg total) by mouth at bedtime. 08/20/18   Doreene Eland, MD  ?Prenatal Vit-Fe Fumarate-FA (PRENATAL MULTIVITAMIN) TABS tablet Take  1 tablet by mouth daily at 12 noon.    [provider]  ?   ? ?Allergies    ?Patient has no known allergies.   ? ?Review of Systems   ?Review of Systems ?See HPI ? ?Physical Exam ?Updated Vital Signs ?BP 121/62 (BP Location: Left Arm)   Pulse 89   Temp 98.2 ?F (36.8 ?C) (Oral)   Resp 15   Ht 5\' 8"  (1.727 m)   Wt 99.8 kg   LMP 10/31/2021   SpO2 100%   BMI 33.45 kg/m?  ?Physical Exam ?Vitals and nursing note reviewed.  ?Constitutional:   ?   General: She is not in acute distress. ?   Appearance: Normal appearance. She is not ill-appearing.  ?HENT:  ?   Head: Normocephalic and atraumatic.  ?Eyes:  ?   General: No scleral icterus. ?   Conjunctiva/sclera: Conjunctivae normal.  ?Pulmonary:  ?   Effort: Pulmonary effort is normal. No respiratory distress.  ?Skin: ?   General: Skin is warm and dry.  ?   Findings: No rash.  ?   Comments: Quarter size abscess to the patient's right buttocks, just lateral to the gluteal cleft.  No drainage.  Some lateral cellulitis.  No communication to the anus.  ?Neurological:  ?   Mental Status: She is alert.  ?Psychiatric:     ?   Mood and Affect: Mood normal.  ? ? ?ED Results / Procedures / Treatments   ?Labs ?(all labs ordered are listed, but only  abnormal results are displayed) ?Labs Reviewed - No data to display ? ?EKG ?None ? ?Radiology ?No results found. ? ?Procedures ?Marland Kitchen.Incision and Drainage ? ?Date/Time: 11/21/2021 5:28 PM ?Performed by: Saddie Benders, PA-C ?Authorized by: Saddie Benders, PA-C  ? ?Consent:  ?  Consent obtained:  Verbal ?  Consent given by:  Patient ?  Risks discussed:  Bleeding, incomplete drainage, pain and infection ?  Alternatives discussed:  No treatment ?Universal protocol:  ?  Procedure explained and questions answered to patient or proxy's satisfaction: yes   ?  Patient identity confirmed:  Verbally with patient ?Location:  ?  Type:  Abscess ?  Size:  2.5 ?  Location:  Anogenital ?  Anogenital location:  Gluteal cleft ?Pre-procedure  details:  ?  Skin preparation:  Chlorhexidine with alcohol ?Sedation:  ?  Sedation type:  None ?Anesthesia:  ?  Anesthesia method:  Local infiltration ?  Local anesthetic:  Lidocaine 1% WITH epi and lidocaine 2% w/o epi ?Procedure type:  ?  Complexity:  Simple ?Procedure details:  ?  Ultrasound guidance: no   ?  Needle aspiration: yes   ?  Needle size:  25 G ?  Incision types:  Single straight ?  Incision depth:  Subcutaneous ?  Wound management:  Probed and deloculated and irrigated with saline ?  Drainage:  Purulent ?  Drainage amount:  Moderate ?  Wound treatment:  Wound left open ?  Packing materials:  1/4 in gauze ?  Amount 1/4":  1in ?Post-procedure details:  ?  Procedure completion:  Tolerated well, no immediate complications  ? ?Medications Ordered in ED ?Medications  ?lidocaine (PF) (XYLOCAINE) 1 % injection 5 mL (5 mLs Intradermal Given by Other 11/21/21 1646)  ? ? ?ED Course/ Medical Decision Making/ A&P ?  ?                        ?Medical Decision Making ?Risk ?Prescription drug management. ? ? ?26 year old female presenting today due to an abscess.  Says it has been going on for about a week however she struggles with recurrent abscesses to this area. ? ?Physical exam: Remarkable for a 2.5 cm fluctuant abscess to the right buttocks.  Minor tenderness. ? ?MDM/disposition: Patient's abscess was drained without any complications.  I believe she is stable to follow-up in 3 days for a wound check.  She will be started on doxycycline, we discussed the side effects of this medication.  She will follow-up with the surgeon this summer and return with any concerns. ? ?Final Clinical Impression(s) / ED Diagnoses ?Final diagnoses:  ?Abscess of buttock, right  ? ? ?Rx / DC Orders ?ED Discharge Orders   ? ?      Ordered  ?  doxycycline (VIBRAMYCIN) 100 MG capsule  2 times daily       ? 11/21/21 1717  ? ?  ?  ? ?  ? ?Results and diagnoses were explained to the patient. Return precautions discussed in full. Patient had  no additional questions and expressed complete understanding. ? ? ?This chart was dictated using voice recognition software.  Despite best efforts to proofread,  errors can occur which can change the documentation meaning.  ?  ?Saddie Benders, PA-C ?11/21/21 1730 ? ?  ?Glendora Score, MD ?11/21/21 2253 ? ?

## 2021-11-21 NOTE — ED Notes (Signed)
I&D Tray at bedside. 

## 2021-11-21 NOTE — Discharge Instructions (Addendum)
Please follow-up with your primary care, urgent care or if needed in emergency department to have your abscess checked in 3 days.  Take the antibiotics as prescribed, know that they may cause some diarrhea. ? ?Read the information about abscesses attached to these discharge papers for further information.  You should return if you have fevers, chills or any worsening/concerning symptoms. ?

## 2021-11-21 NOTE — ED Triage Notes (Signed)
Pt arrives ambulatory to ED with reports of recurrent boil to her buttocks states that she was seen at [facility] and had the area drained. Pt reports she tried to call a Psychologist, sport and exercise but they are booked for some months out and the area has come back. Pt also reports she will need a note for work today.  ?

## 2021-11-24 ENCOUNTER — Telehealth: Payer: Self-pay

## 2021-11-24 NOTE — Telephone Encounter (Signed)
Transition Care Management Follow-up Telephone Call ?Date of discharge and from where: 11/21/2021 from [facility] ?How have you been since you were released from the hospital? Patient stated that she is feeling better. Patient has started the abx rx'ed and did not have any questions at this time.  ?Any questions or concerns? No ? ?Items Reviewed: ?Did the pt receive and understand the discharge instructions provided? Yes  ?Medications obtained and verified? Yes  ?Other? No  ?Any new allergies since your discharge? No  ?Dietary orders reviewed? No ?Do you have support at home? Yes  ? ?Functional Questionnaire: (I = Independent and D = Dependent) ?ADLs: I ? ?Bathing/Dressing- I ? ?Meal Prep- I ? ?Eating- I ? ?Maintaining continence- I ? ?Transferring/Ambulation- I ? ?Managing Meds- I ? ?Follow up appointments reviewed: ? ?PCP Hospital f/u appt confirmed? No  Patient is looking for a new PCP.  ?[facility] f/u appt confirmed? No   ?Are transportation arrangements needed? No  ?If their condition worsens, is the pt aware to call PCP or go to the Emergency Dept.? Yes ?Was the patient provided with contact information for the PCP's office or ED? Yes ?Was to pt encouraged to call back with questions or concerns? Yes ? ?

## 2022-03-07 ENCOUNTER — Other Ambulatory Visit: Payer: Self-pay

## 2022-03-07 ENCOUNTER — Emergency Department (HOSPITAL_BASED_OUTPATIENT_CLINIC_OR_DEPARTMENT_OTHER)
Admission: EM | Admit: 2022-03-07 | Discharge: 2022-03-07 | Disposition: A | Discharge status: Home / Self Care | Payer: Medicaid Other | Attending: Emergency Medicine | Admitting: Emergency Medicine

## 2022-03-07 ENCOUNTER — Encounter (HOSPITAL_BASED_OUTPATIENT_CLINIC_OR_DEPARTMENT_OTHER): Payer: Self-pay | Admitting: Emergency Medicine

## 2022-03-07 DIAGNOSIS — L0501 Pilonidal cyst with abscess: Secondary | ICD-10-CM

## 2022-03-07 MED ORDER — LIDOCAINE-EPINEPHRINE (PF) 2 %-1:200000 IJ SOLN
10.0000 mL | Freq: Once | INTRAMUSCULAR | Status: AC
  Administered 2022-03-07: 10 mL
  Filled 2022-03-07: qty 20

## 2022-03-07 NOTE — ED Notes (Signed)
ED Provider at bedside for I&D of abscess. Tol well, nurse present for procedure

## 2022-03-07 NOTE — Discharge Instructions (Addendum)
You were seen for recurrent pilonidal abscess.  The area was drained with good amount of discharge released.  Please do warm wet soaks.  Remove packing in 2 days.  Tylenol ibuprofen for pain.  Follow-up with dermatology as scheduled.  Return if any worsening or concerning symptoms

## 2022-03-07 NOTE — ED Provider Notes (Signed)
[facility] EMERGENCY DEPARTMENT Provider Note   CSN: [id number] Arrival date & time: 03/07/22  4098     History  Chief Complaint  Patient presents with   Abscess    Sara Gill is a 26 y.o. female.  She is here with a complaint of a recurrent pilonidal abscess.  She said its been there about 4 days.  Has tried nothing for it.  She had 1 prior that required I&D and packing and she scheduled a follow-up appointment with dermatology but that is not till December.  No fevers chills nausea vomiting.  The history is provided by the patient.  Abscess Location:  Pelvis Pelvic abscess location:  Gluteal cleft Size:  3 Abscess quality: painful   Abscess quality: no induration   Duration:  4 days Progression:  Unchanged Pain details:    Quality:  Throbbing   Timing:  Constant   Progression:  Unchanged Chronicity:  Recurrent Relieved by:  None tried Worsened by:  Nothing Ineffective treatments:  None tried Associated symptoms: no fever        Home Medications Prior to Admission medications   Medication Sig Start Date End Date Taking? Authorizing Provider  acetaminophen (TYLENOL) 500 MG tablet Take 1,000 mg by mouth every 6 (six) hours as needed for moderate pain.    [provider]  albuterol (PROVENTIL HFA;VENTOLIN HFA) 108 (90 Base) MCG/ACT inhaler Inhale 2 puffs into the lungs every 6 (six) hours as needed for wheezing or shortness of breath. Shortness of breath/wheezing 08/20/18   Janit Pagan T, MD  albuterol (PROVENTIL) (2.5 MG/3ML) 0.083% nebulizer solution Take 2.5 mg by nebulization every 6 (six) hours as needed. Shortness of breath/wheezing    [provider]  cetirizine (ZYRTEC) 10 MG tablet Take 1 tablet (10 mg total) by mouth daily. 08/20/18   Doreene Eland, MD  doxycycline (VIBRAMYCIN) 100 MG capsule Take 1 capsule (100 mg total) by mouth 2 (two) times daily. 11/21/21   Redwine, Madison A, PA-C  HYDROcodone-acetaminophen  (NORCO/VICODIN) 5-325 MG tablet Take 1-2 tablets by mouth every 6 (six) hours as needed. 02/28/19   Benjiman Core, MD  montelukast (SINGULAIR) 10 MG tablet Take 1 tablet (10 mg total) by mouth at bedtime. 08/20/18   Doreene Eland, MD  Prenatal Vit-Fe Fumarate-FA (PRENATAL MULTIVITAMIN) TABS tablet Take 1 tablet by mouth daily at 12 noon.    [provider]      Allergies    Patient has no known allergies.    Review of Systems   Review of Systems  Constitutional:  Negative for fever.    Physical Exam Updated Vital Signs BP 117/84   Pulse 78   Temp 98.1 F (36.7 C) (Oral)   Resp 18   Wt 99.8 kg   SpO2 99%   BMI 33.45 kg/m  Physical Exam Vitals and nursing note reviewed.  Constitutional:      General: She is not in acute distress.    Appearance: Normal appearance. She is well-developed.  HENT:     Head: Normocephalic and atraumatic.  Eyes:     Conjunctiva/sclera: Conjunctivae normal.  Cardiovascular:     Rate and Rhythm: Normal rate and regular rhythm.     Heart sounds: No murmur heard. Pulmonary:     Effort: Pulmonary effort is normal. No respiratory distress.     Breath sounds: Normal breath sounds.  Abdominal:     Palpations: Abdomen is soft.     Tenderness: There is no abdominal tenderness. There  is no guarding or rebound.  Musculoskeletal:     Cervical back: Neck supple.     Comments: 3 cm pilonidal abscess tender no surrounding induration or erythema.  Skin:    General: Skin is warm and dry.     Capillary Refill: Capillary refill takes less than 2 seconds.  Neurological:     General: No focal deficit present.     Mental Status: She is alert.    ED Results / Procedures / Treatments   Labs (all labs ordered are listed, but only abnormal results are displayed) Labs Reviewed - No data to display  EKG None  Radiology No results found.  Procedures .Marland KitchenIncision and Drainage  Date/Time: 03/07/2022 7:45 AM  Performed by: Terrilee Files,  MD Authorized by: Terrilee Files, MD   Consent:    Consent obtained:  Verbal   Consent given by:  Patient   Risks discussed:  Bleeding, incomplete drainage, pain and infection   Alternatives discussed:  No treatment, delayed treatment and referral Universal protocol:    Procedure explained and questions answered to patient or proxy's satisfaction: yes     Patient identity confirmed:  Verbally with patient Location:    Type:  Pilonidal cyst   Size:  3   Location:  Anogenital   Anogenital location:  Gluteal cleft Pre-procedure details:    Skin preparation:  Povidone-iodine Anesthesia:    Anesthesia method:  Local infiltration   Local anesthetic:  Lidocaine 2% WITH epi Procedure type:    Complexity:  Complex Procedure details:    Incision types:  Single straight   Drainage:  Purulent   Drainage amount:  Moderate   Packing materials:  1/4 in iodoform gauze   Amount 1/4" iodoform:  6 Post-procedure details:    Procedure completion:  Tolerated well, no immediate complications     Medications Ordered in ED Medications  lidocaine-EPINEPHrine (XYLOCAINE W/EPI) 2 %-1:200000 (PF) injection 10 mL (has no administration in time range)    ED Course/ Medical Decision Making/ A&P                           26 year old healthy female here with recurrent pilonidal abscess.  Otherwise nontoxic-appearing stable vitals.  After informed consent area was incised and drained with good relief of symptoms.  Packing placed and management discussed with patient.  Return instructions discussed.          Final Clinical Impression(s) / ED Diagnoses Final diagnoses:  Pilonidal abscess    Rx / DC Orders ED Discharge Orders     None         Terrilee Files, MD 03/07/22 [phone]

## 2022-03-07 NOTE — ED Triage Notes (Signed)
Pt in with abcess to buttocks, hx of pilonidal cysts. No drainage noted to area, about golf-ball sized.

## 2022-03-07 NOTE — ED Notes (Signed)
I&D Tray and lidocaine placed at bedside for provider

## 2022-03-07 NOTE — ED Notes (Signed)
Pt discharged to home. Discharge instructions have been discussed with patient and/or family members. Pt verbally acknowledges understanding d/c instructions, and endorses comprehension to checkout at registration before leaving.  °

## 2022-05-16 ENCOUNTER — Emergency Department (HOSPITAL_BASED_OUTPATIENT_CLINIC_OR_DEPARTMENT_OTHER)
Admission: EM | Admit: 2022-05-16 | Discharge: 2022-05-16 | Disposition: A | Discharge status: Home / Self Care | Payer: Medicaid Other | Attending: Emergency Medicine | Admitting: Emergency Medicine

## 2022-05-16 ENCOUNTER — Other Ambulatory Visit: Payer: Self-pay

## 2022-05-16 DIAGNOSIS — L0501 Pilonidal cyst with abscess: Secondary | ICD-10-CM | POA: Diagnosis not present

## 2022-05-16 MED ORDER — OXYCODONE HCL 5 MG PO TABS
5.0000 mg | ORAL_TABLET | Freq: Once | ORAL | Status: AC
  Administered 2022-05-16: 5 mg via ORAL
  Filled 2022-05-16: qty 1

## 2022-05-16 MED ORDER — LIDOCAINE-EPINEPHRINE (PF) 2 %-1:200000 IJ SOLN
10.0000 mL | Freq: Once | INTRAMUSCULAR | Status: AC
  Administered 2022-05-16: 10 mL
  Filled 2022-05-16: qty 20

## 2022-05-16 NOTE — Discharge Instructions (Addendum)
Continue to do sits baths for the next 3 to 5 days as your abscess is healing.  Slowly the packing material should fall out on its own but you can also pull it out.  Take Tylenol and ibuprofen as needed for pain.  Come back for any concern for worsening infection such as increasing redness, high fevers, worsening drainage, or any other symptoms concerning to you.

## 2022-05-16 NOTE — ED Provider Notes (Signed)
[facility] EMERGENCY DEPARTMENT Provider Note   CSN: [id number] Arrival date & time: 05/16/22  8756     History  Chief Complaint  Patient presents with   Abscess    Sara Gill is a 26 y.o. female.  With PMH of pilonidal abscess who presents with recurrent pilonidal abscess.  She has had these drained about 4 times in the past year.  She started developing some increasing pain and some drainage over the past 1 to 2 days.  She has had no fevers, no nausea, no vomiting no diarrhea.  She is supposed to see dermatology in December for removal of the sac.   Abscess      Home Medications Prior to Admission medications   Medication Sig Start Date End Date Taking? Authorizing Provider  acetaminophen (TYLENOL) 500 MG tablet Take 1,000 mg by mouth every 6 (six) hours as needed for moderate pain.    [provider]  albuterol (PROVENTIL HFA;VENTOLIN HFA) 108 (90 Base) MCG/ACT inhaler Inhale 2 puffs into the lungs every 6 (six) hours as needed for wheezing or shortness of breath. Shortness of breath/wheezing 08/20/18   Janit Pagan T, MD  albuterol (PROVENTIL) (2.5 MG/3ML) 0.083% nebulizer solution Take 2.5 mg by nebulization every 6 (six) hours as needed. Shortness of breath/wheezing    [provider]  cetirizine (ZYRTEC) 10 MG tablet Take 1 tablet (10 mg total) by mouth daily. 08/20/18   Doreene Eland, MD  doxycycline (VIBRAMYCIN) 100 MG capsule Take 1 capsule (100 mg total) by mouth 2 (two) times daily. 11/21/21   Redwine, Madison A, PA-C  HYDROcodone-acetaminophen (NORCO/VICODIN) 5-325 MG tablet Take 1-2 tablets by mouth every 6 (six) hours as needed. 02/28/19   Benjiman Core, MD  montelukast (SINGULAIR) 10 MG tablet Take 1 tablet (10 mg total) by mouth at bedtime. 08/20/18   Doreene Eland, MD  Prenatal Vit-Fe Fumarate-FA (PRENATAL MULTIVITAMIN) TABS tablet Take 1 tablet by mouth daily at 12 noon.    [provider]      Allergies     Patient has no known allergies.    Review of Systems   Review of Systems  Physical Exam Updated Vital Signs BP (!) 120/96   Pulse 73   Temp 98.8 F (37.1 C) (Oral)   Resp 18   Ht 5\' 8"  (1.727 m)   Wt 99.8 kg   LMP 04/27/2022 (Approximate)   SpO2 99%   BMI 33.45 kg/m  Physical Exam Constitutional: Alert and oriented. Well appearing and in no distress. Eyes: Conjunctivae are normal. ENT      Head: Normocephalic and atraumatic.      Nose: No congestion.      Mouth/Throat: Mucous membranes are moist.      Neck: No stridor. Cardiovascular: S1, S2, regular rate Respiratory: Normal respiratory effort. Gastrointestinal: Soft and nontender.  Small pillow nidal abscess at the superior right-sided gluteal cleft with already active small purulent drainage, no superficial erythema or streaking, mild tenderness to the touch  musculoskeletal: Normal range of motion in all extremities. Neurologic: Normal speech and language. No gross focal neurologic deficits are appreciated. Skin: Skin is warm, dry and intact. No rash noted. Psychiatric: Mood and affect are normal. Speech and behavior are normal.  ED Results / Procedures / Treatments   Labs (all labs ordered are listed, but only abnormal results are displayed) Labs Reviewed - No data to display  EKG None  Radiology No results found.  Procedures .11/04/2023Incision and Drainage  Date/Time:  05/16/2022 7:40 AM  Performed by: Elgie Congo, MD Authorized by: Elgie Congo, MD   Consent:    Consent obtained:  Verbal   Consent given by:  Patient   Risks discussed:  Bleeding, incomplete drainage and infection   Alternatives discussed:  No treatment Universal protocol:    Patient identity confirmed:  Verbally with patient Location:    Indications for incision and drainage: pilonidal abscess.   Location: superior gluteal cleft. Pre-procedure details:    Skin preparation:  Chlorhexidine Anesthesia:    Anesthesia method:   Local infiltration   Local anesthetic:  Lidocaine 2% WITH epi Procedure type:    Complexity:  Simple Procedure details:    Incision types:  Single straight   Wound management:  Probed and deloculated and irrigated with saline   Drainage:  Bloody and purulent   Drainage amount:  Scant   Packing materials:  1/4 in iodoform gauze Post-procedure details:    Procedure completion:  Tolerated well, no immediate complications     Medications Ordered in ED Medications  oxyCODONE (Oxy IR/ROXICODONE) immediate release tablet 5 mg (has no administration in time range)  lidocaine-EPINEPHrine (XYLOCAINE W/EPI) 2 %-1:200000 (PF) injection 10 mL (has no administration in time range)    ED Course/ Medical Decision Making/ A&P                           Medical Decision Making  Sara Gill is a 26 y.o. female.  With PMH of pilonidal abscess who presents with recurrent pilonidal abscess.  There was a small pilonidal abscess with no external evidence of cellulitis or superficial infection.  See procedure note for I&D of small pilonidal abscess.  Advised continued supportive care, wound care, Tylenol and ibuprofen for pain control.  Safe for discharge and follow-up with PCP.  Risk Prescription drug management.    Final Clinical Impression(s) / ED Diagnoses Final diagnoses:  None    Rx / DC Orders ED Discharge Orders     None         Elgie Congo, MD 05/16/22 [phone]

## 2022-05-16 NOTE — ED Triage Notes (Signed)
POV from home, ambulatory.  Pt sts she has a reoccurring abscess that has gotten worse since yesterday. Appt with derm in dec but sts "it feels like it's about to pop now". Abscess on sacrum and pt denies drainage.

## 2022-10-11 ENCOUNTER — Emergency Department (HOSPITAL_COMMUNITY)
Admission: EM | Admit: 2022-10-11 | Discharge: 2022-10-11 | Disposition: A | Discharge status: Home / Self Care | Payer: Self-pay | Attending: Emergency Medicine | Admitting: Emergency Medicine

## 2022-10-11 ENCOUNTER — Other Ambulatory Visit: Payer: Self-pay

## 2022-10-11 ENCOUNTER — Encounter (HOSPITAL_COMMUNITY): Payer: Self-pay

## 2022-10-11 ENCOUNTER — Emergency Department (HOSPITAL_COMMUNITY): Payer: Self-pay

## 2022-10-11 DIAGNOSIS — X58XXXA Exposure to other specified factors, initial encounter: Secondary | ICD-10-CM | POA: Insufficient documentation

## 2022-10-11 DIAGNOSIS — T148XXA Other injury of unspecified body region, initial encounter: Secondary | ICD-10-CM

## 2022-10-11 DIAGNOSIS — S161XXA Strain of muscle, fascia and tendon at neck level, initial encounter: Secondary | ICD-10-CM | POA: Insufficient documentation

## 2022-10-11 MED ORDER — IBUPROFEN 800 MG PO TABS: 800.0000 mg | ORAL_TABLET | Freq: Three times a day (TID) | ORAL | 0 refills | Status: DC | PRN

## 2022-10-11 MED ORDER — CYCLOBENZAPRINE HCL 10 MG PO TABS: 10.0000 mg | ORAL_TABLET | Freq: Three times a day (TID) | ORAL | 0 refills | Status: DC | PRN

## 2022-10-11 NOTE — Discharge Instructions (Addendum)
Follow up with dr. Aline Brochure if not improving

## 2022-10-11 NOTE — ED Provider Notes (Signed)
[facility] Provider Note   CSN: [id number] Arrival date & time: 10/11/22  1608     History {Add pertinent medical, surgical, social history, OB history to HPI:1} No chief complaint on file.   Sara Gill is a 27 y.o. female.  Patient has a history of bronchospasm.  She complains of left-sided neck pain   Muscle Pain       Home Medications Prior to Admission medications   Medication Sig Start Date End Date Taking? Authorizing Provider  cyclobenzaprine (FLEXERIL) 10 MG tablet Take 1 tablet (10 mg total) by mouth 3 (three) times daily as needed for muscle spasms. 10/11/22  Yes Milton Ferguson, MD  ibuprofen (ADVIL) 800 MG tablet Take 1 tablet (800 mg total) by mouth every 8 (eight) hours as needed for moderate pain. 10/11/22  Yes Milton Ferguson, MD  acetaminophen (TYLENOL) 500 MG tablet Take 1,000 mg by mouth every 6 (six) hours as needed for moderate pain.    [provider]  albuterol (PROVENTIL HFA;VENTOLIN HFA) 108 (90 Base) MCG/ACT inhaler Inhale 2 puffs into the lungs every 6 (six) hours as needed for wheezing or shortness of breath. Shortness of breath/wheezing 08/20/18   Andrena Mews T, MD  albuterol (PROVENTIL) (2.5 MG/3ML) 0.083% nebulizer solution Take 2.5 mg by nebulization every 6 (six) hours as needed. Shortness of breath/wheezing    [provider]  cetirizine (ZYRTEC) 10 MG tablet Take 1 tablet (10 mg total) by mouth daily. 08/20/18   Kinnie Feil, MD  doxycycline (VIBRAMYCIN) 100 MG capsule Take 1 capsule (100 mg total) by mouth 2 (two) times daily. 11/21/21   Redwine, Madison A, PA-C  HYDROcodone-acetaminophen (NORCO/VICODIN) 5-325 MG tablet Take 1-2 tablets by mouth every 6 (six) hours as needed. 02/28/19   Davonna Belling, MD  montelukast (SINGULAIR) 10 MG tablet Take 1 tablet (10 mg total) by mouth at bedtime. 08/20/18   Kinnie Feil, MD  Prenatal Vit-Fe Fumarate-FA (PRENATAL  MULTIVITAMIN) TABS tablet Take 1 tablet by mouth daily at 12 noon.    [provider]      Allergies    Patient has no known allergies.    Review of Systems   Review of Systems  Physical Exam Updated Vital Signs BP 131/78   Pulse 80   Temp (!) 97.5 F (36.4 C) (Oral)   Resp 20   Ht 5' 9"$  (1.753 m)   Wt 95.3 kg   LMP 10/10/2022   SpO2 98%   BMI 31.01 kg/m  Physical Exam  ED Results / Procedures / Treatments   Labs (all labs ordered are listed, but only abnormal results are displayed) Labs Reviewed - No data to display  EKG None  Radiology DG Cervical Spine Complete  Result Date: 10/11/2022 CLINICAL DATA:  Neck pain, possible pulled  muscle on left EXAM: CERVICAL SPINE - COMPLETE 4+ VIEW COMPARISON:  None Available. FINDINGS: Seven cervical segments are well visualized. Loss of normal cervical lordosis is noted which may be related to muscular spasm. No acute fracture or acute facet abnormality is noted. No neural foraminal narrowing is seen. The odontoid is within normal limits. IMPRESSION: No acute bony abnormality noted. Loss of normal cervical lordosis likely related to muscular spasm. Electronically Signed   By: Inez Catalina M.D.   On: 10/11/2022 17:04    Procedures Procedures  {Document cardiac monitor, telemetry assessment procedure when appropriate:1}  Medications Ordered in ED Medications - No data to display  ED Course/  Medical Decision Making/ A&P   {   Click here for ABCD2, HEART and other calculatorsREFRESH Note before signing :1}                          Medical Decision Making Amount and/or Complexity of Data Reviewed Radiology: ordered.  Risk Prescription drug management.   Patient with a neck muscle strain.  She is placed on Motrin and Flexeril and follow-up as needed  {Document critical care time when appropriate:1} {Document review of labs and clinical decision tools ie heart score, Chads2Vasc2 etc:1}  {Document your independent  review of radiology images, and any outside records:1} {Document your discussion with family members, caretakers, and with consultants:1} {Document social determinants of health affecting pt's care:1} {Document your decision making why or why not admission, treatments were needed:1} Final Clinical Impression(s) / ED Diagnoses Final diagnoses:  Muscle strain    Rx / DC Orders ED Discharge Orders          Ordered    ibuprofen (ADVIL) 800 MG tablet  Every 8 hours PRN        10/11/22 1735    cyclobenzaprine (FLEXERIL) 10 MG tablet  3 times daily PRN        10/11/22 1735

## 2022-10-11 NOTE — ED Triage Notes (Signed)
I've pulled a muscle in my neck, left side is worse. I've been using icy hot on it.

## 2023-02-11 ENCOUNTER — Other Ambulatory Visit: Payer: Self-pay

## 2023-02-11 ENCOUNTER — Emergency Department (HOSPITAL_COMMUNITY): Payer: Medicaid Other

## 2023-02-11 ENCOUNTER — Inpatient Hospital Stay (HOSPITAL_COMMUNITY)
Admission: EM | Admit: 2023-02-11 | Discharge: 2023-02-14 | DRG: 759 | Disposition: A | Discharge status: Home / Self Care | Payer: Medicaid Other | Attending: Family Medicine | Admitting: Family Medicine

## 2023-02-11 ENCOUNTER — Encounter (HOSPITAL_COMMUNITY): Payer: Self-pay | Admitting: *Deleted

## 2023-02-11 DIAGNOSIS — N730 Acute parametritis and pelvic cellulitis: Secondary | ICD-10-CM | POA: Diagnosis present

## 2023-02-11 DIAGNOSIS — F17210 Nicotine dependence, cigarettes, uncomplicated: Secondary | ICD-10-CM | POA: Diagnosis present

## 2023-02-11 DIAGNOSIS — E876 Hypokalemia: Secondary | ICD-10-CM | POA: Diagnosis present

## 2023-02-11 DIAGNOSIS — K5900 Constipation, unspecified: Secondary | ICD-10-CM | POA: Diagnosis present

## 2023-02-11 DIAGNOSIS — R109 Unspecified abdominal pain: Secondary | ICD-10-CM | POA: Diagnosis present

## 2023-02-11 DIAGNOSIS — N10 Acute tubulo-interstitial nephritis: Principal | ICD-10-CM

## 2023-02-11 DIAGNOSIS — N7093 Salpingitis and oophoritis, unspecified: Secondary | ICD-10-CM | POA: Diagnosis present

## 2023-02-11 DIAGNOSIS — J45909 Unspecified asthma, uncomplicated: Secondary | ICD-10-CM | POA: Diagnosis present

## 2023-02-11 DIAGNOSIS — R1030 Lower abdominal pain, unspecified: Secondary | ICD-10-CM

## 2023-02-11 LAB — CBC
HCT: 37.5 % (ref 36.0–46.0)
Hemoglobin: 11.9 g/dL — ABNORMAL LOW (ref 12.0–15.0)
MCH: 26.4 pg (ref 26.0–34.0)
MCHC: 31.7 g/dL (ref 30.0–36.0)
MCV: 83.3 fL (ref 80.0–100.0)
Platelets: 216 10*3/uL (ref 150–400)
RBC: 4.5 MIL/uL (ref 3.87–5.11)
RDW: 18.1 % — ABNORMAL HIGH (ref 11.5–15.5)
WBC: 18.7 10*3/uL — ABNORMAL HIGH (ref 4.0–10.5)
nRBC: 0 % (ref 0.0–0.2)

## 2023-02-11 LAB — URINALYSIS, ROUTINE W REFLEX MICROSCOPIC
Bilirubin Urine: NEGATIVE
Glucose, UA: NEGATIVE mg/dL
Ketones, ur: 80 mg/dL — AB
Nitrite: NEGATIVE
Protein, ur: 100 mg/dL — AB
Specific Gravity, Urine: 1.013 (ref 1.005–1.030)
WBC, UA: >50 WBC/hpf (ref 0–5)
pH: 6 (ref 5.0–8.0)

## 2023-02-11 LAB — COMPREHENSIVE METABOLIC PANEL
ALT: 20 U/L (ref 0–44)
AST: 20 U/L (ref 15–41)
Albumin: 4.3 g/dL (ref 3.5–5.0)
Alkaline Phosphatase: 94 U/L (ref 38–126)
Anion gap: 15 (ref 5–15)
BUN: 6 mg/dL (ref 6–20)
CO2: 22 mmol/L (ref 22–32)
Calcium: 9.8 mg/dL (ref 8.9–10.3)
Chloride: 97 mmol/L — ABNORMAL LOW (ref 98–111)
Creatinine, Ser: 0.69 mg/dL (ref 0.44–1.00)
GFR, Estimated: >60 mL/min (ref >60)
Glucose, Bld: 99 mg/dL (ref 70–99)
Potassium: 3.2 mmol/L — ABNORMAL LOW (ref 3.5–5.1)
Sodium: 134 mmol/L — ABNORMAL LOW (ref 135–145)
Total Bilirubin: 1.2 mg/dL (ref 0.3–1.2)
Total Protein: 9.4 g/dL — ABNORMAL HIGH (ref 6.5–8.1)

## 2023-02-11 LAB — WET PREP, GENITAL
Sperm: NONE SEEN
Trich, Wet Prep: NONE SEEN
WBC, Wet Prep HPF POC: >=10 — AB (ref <10)
Yeast Wet Prep HPF POC: NONE SEEN

## 2023-02-11 LAB — RAPID URINE DRUG SCREEN, HOSP PERFORMED
Amphetamines: POSITIVE — AB
Barbiturates: NOT DETECTED
Benzodiazepines: NOT DETECTED
Cocaine: NOT DETECTED
Opiates: NOT DETECTED
Tetrahydrocannabinol: POSITIVE — AB

## 2023-02-11 LAB — PREGNANCY, URINE: Preg Test, Ur: NEGATIVE

## 2023-02-11 LAB — LIPASE, BLOOD: Lipase: 27 U/L (ref 11–51)

## 2023-02-11 MED ORDER — OXYCODONE-ACETAMINOPHEN 5-325 MG PO TABS
1.0000 | ORAL_TABLET | ORAL | Status: DC | PRN
  Administered 2023-02-12 – 2023-02-14 (×9): 2 via ORAL
  Filled 2023-02-11 (×9): qty 2

## 2023-02-11 MED ORDER — ONDANSETRON HCL 4 MG/2ML IJ SOLN
4.0000 mg | Freq: Once | INTRAMUSCULAR | Status: DC
  Filled 2023-02-11: qty 2

## 2023-02-11 MED ORDER — HYDROMORPHONE HCL 1 MG/ML IJ SOLN
0.5000 mg | Freq: Once | INTRAMUSCULAR | Status: AC
  Administered 2023-02-11: 0.5 mg via INTRAVENOUS
  Filled 2023-02-11: qty 0.5

## 2023-02-11 MED ORDER — METRONIDAZOLE 500 MG/100ML IV SOLN
500.0000 mg | Freq: Once | INTRAVENOUS | Status: AC
  Administered 2023-02-11: 500 mg via INTRAVENOUS
  Filled 2023-02-11: qty 100

## 2023-02-11 MED ORDER — IBUPROFEN 600 MG PO TABS: 600.0000 mg | ORAL_TABLET | Freq: Four times a day (QID) | ORAL | Status: DC | PRN

## 2023-02-11 MED ORDER — LACTATED RINGERS IV BOLUS
1000.0000 mL | Freq: Once | INTRAVENOUS | Status: AC
  Administered 2023-02-11: 1000 mL via INTRAVENOUS

## 2023-02-11 MED ORDER — CEFTRIAXONE SODIUM 1 G IJ SOLR
1.0000 g | Freq: Once | INTRAMUSCULAR | Status: AC
  Administered 2023-02-11: 1 g via INTRAVENOUS
  Filled 2023-02-11: qty 10

## 2023-02-11 MED ORDER — ACETAMINOPHEN 500 MG PO TABS
1000.0000 mg | ORAL_TABLET | Freq: Once | ORAL | Status: AC
  Administered 2023-02-11: 1000 mg via ORAL
  Filled 2023-02-11: qty 2

## 2023-02-11 MED ORDER — IOHEXOL 300 MG/ML  SOLN
100.0000 mL | Freq: Once | INTRAMUSCULAR | Status: AC | PRN
  Administered 2023-02-11: 100 mL via INTRAVENOUS

## 2023-02-11 MED ORDER — ZOLPIDEM TARTRATE 5 MG PO TABS
5.0000 mg | ORAL_TABLET | Freq: Every evening | ORAL | Status: DC | PRN
  Administered 2023-02-12 – 2023-02-13 (×3): 5 mg via ORAL
  Filled 2023-02-11 (×3): qty 1

## 2023-02-11 MED ORDER — POTASSIUM CHLORIDE CRYS ER 20 MEQ PO TBCR
40.0000 meq | EXTENDED_RELEASE_TABLET | Freq: Once | ORAL | Status: AC
  Administered 2023-02-11: 40 meq via ORAL
  Filled 2023-02-11: qty 2

## 2023-02-11 MED ORDER — SODIUM CHLORIDE 0.9 % IV SOLN
100.0000 mg | Freq: Once | INTRAVENOUS | Status: AC
  Administered 2023-02-11: 100 mg via INTRAVENOUS
  Filled 2023-02-11: qty 100

## 2023-02-11 MED ORDER — IBUPROFEN 400 MG PO TABS
400.0000 mg | ORAL_TABLET | Freq: Once | ORAL | Status: AC
  Administered 2023-02-11: 400 mg via ORAL
  Filled 2023-02-11: qty 1

## 2023-02-11 NOTE — Discharge Instructions (Addendum)
It was our pleasure to provide your ER care today - we hope that you feel better.  Drink plenty of fluids/stay well hydrated.   Take antibiotics as prescribed.  Take acetaminophen or ibuprofen as need.  Follow up with ob/gyn doctor in the coming week if symptoms fail to improve/resolve.  From today's labs, your potassium level is low - eat plenty of fruits and vegetables, and follow up with primary care doctor in one week.   Return to ER if worse, new symptoms, worsening or severe pain, persistent vomiting, high fevers, weak/fainting, or other concern.   You were given pain meds in the ER - no driving for the next 6 hours.

## 2023-02-11 NOTE — ED Notes (Signed)
[facility] to room to p/u pt for transport to [facility].  Pt unaware that she is being admitted, Dr. Estell Harpin notified to room to talk to pt.  Pt tearful wanting to go home.  This RN to room to talk with pt about need for admission and concerns for decline should she go home.  Pt remains tearful but more willing to transport to [facility].  Pt asking to get some things from her car before leaving.  This RN escorted pt to her car to retrieve personal belongings.  Pt returned to department and is agreeable to transport at this time.  Pt transferred to [facility] services and transported to [facility] for admission.

## 2023-02-11 NOTE — ED Notes (Signed)
Patient given water at this time.  

## 2023-02-11 NOTE — ED Notes (Signed)
Report to Tiara, Charity fundraiser at [facility].

## 2023-02-11 NOTE — H&P (Signed)
Sara Gill is an 27 y.o. female. G1P1001 She presented to [facility] today with c/o general abd pan radiating to her back in past couple days with decreased appetite since Sunday, and loose bms last night after taking laxitives. No known bad food ingestion nor ill contacts. No recent antibiotic use or new meds (except did take laxative last evening). No emesis. No abd distension. Last period ~ 3 weeks ago, normal. No vaginal bleeding or discharge. No dysuria. No fever or chills.     Pertinent Gynecological History: Menses: flow is moderate Contraception: none Blood transfusions: none Sexually transmitted diseases: trichomonas  OB History: G1, P1   Menstrual History:  Patient's last menstrual period was 01/22/2023.    Past Medical History:  Diagnosis Date   Asthma     History reviewed. No pertinent surgical history.  Family History  Problem Relation Age of Onset   Healthy Mother    Healthy Father     Social History:  reports that she has been smoking cigarettes. She has a 5.00 pack-year smoking history. She has never used smokeless tobacco. She reports that she does not currently use drugs after having used the following drugs: Marijuana. She reports that she does not drink alcohol.  Allergies: No Known Allergies  Medications Prior to Admission  Medication Sig Dispense Refill Last Dose   albuterol (PROVENTIL HFA;VENTOLIN HFA) 108 (90 Base) MCG/ACT inhaler Inhale 2 puffs into the lungs every 6 (six) hours as needed for wheezing or shortness of breath. Shortness of breath/wheezing 18 g 0 02/11/2023    Review of Systems  Constitutional:  Negative for fever.  Respiratory: Negative.    Cardiovascular: Negative.   Gastrointestinal:  Positive for abdominal pain and diarrhea. Negative for nausea and vomiting.  Genitourinary:  Negative for dysuria, vaginal bleeding and vaginal discharge.    Blood pressure (!) 153/77, pulse (!) 107, temperature 98.9 F (37.2 C),  temperature source Oral, resp. rate 18, height 5\' 8"  (1.727 m), weight 95.3 kg, last menstrual period 01/22/2023, SpO2 99 %, unknown if currently breastfeeding. Physical Exam Vitals and nursing note reviewed.  Constitutional:      Appearance: She is well-developed. She is not ill-appearing.  HENT:     Head: Normocephalic and atraumatic.  Cardiovascular:     Rate and Rhythm: Normal rate.  Pulmonary:     Effort: Pulmonary effort is normal.  Abdominal:     General: Abdomen is flat.     Palpations: Abdomen is soft.     Tenderness: There is no abdominal tenderness.  Skin:    General: Skin is warm and dry.  Neurological:     General: No focal deficit present.     Mental Status: She is alert.  Psychiatric:        Mood and Affect: Mood normal.        Behavior: Behavior normal.     Results for orders placed or performed during the hospital encounter of 02/11/23 (from the past 24 hour(s))  CBC     Status: Abnormal   Collection Time: 02/11/23  9:04 AM  Result Value Ref Range   WBC 18.7 (H) 4.0 - 10.5 K/uL   RBC 4.50 3.87 - 5.11 MIL/uL   Hemoglobin 11.9 (L) 12.0 - 15.0 g/dL   HCT 96.2 95.2 - 84.1 %   MCV 83.3 80.0 - 100.0 fL   MCH 26.4 26.0 - 34.0 pg   MCHC 31.7 30.0 - 36.0 g/dL   RDW 32.4 (H) 40.1 - 02.7 %  Platelets 216 150 - 400 K/uL   nRBC 0.0 0.0 - 0.2 %  Comprehensive metabolic panel     Status: Abnormal   Collection Time: 02/11/23  9:27 AM  Result Value Ref Range   Sodium 134 (L) 135 - 145 mmol/L   Potassium 3.2 (L) 3.5 - 5.1 mmol/L   Chloride 97 (L) 98 - 111 mmol/L   CO2 22 22 - 32 mmol/L   Glucose, Bld 99 70 - 99 mg/dL   BUN 6 6 - 20 mg/dL   Creatinine, Ser 4.09 0.44 - 1.00 mg/dL   Calcium 9.8 8.9 - 81.1 mg/dL   Total Protein 9.4 (H) 6.5 - 8.1 g/dL   Albumin 4.3 3.5 - 5.0 g/dL   AST 20 15 - 41 U/L   ALT 20 0 - 44 U/L   Alkaline Phosphatase 94 38 - 126 U/L   Total Bilirubin 1.2 0.3 - 1.2 mg/dL   GFR, Estimated >91 >47 mL/min   Anion gap 15 5 - 15  Lipase, blood      Status: None   Collection Time: 02/11/23  9:27 AM  Result Value Ref Range   Lipase 27 11 - 51 U/L  Urinalysis, Routine w reflex microscopic -Urine, Clean Catch     Status: Abnormal   Collection Time: 02/11/23 11:34 AM  Result Value Ref Range   Color, Urine YELLOW YELLOW   APPearance HAZY (A) CLEAR   Specific Gravity, Urine 1.013 1.005 - 1.030   pH 6.0 5.0 - 8.0   Glucose, UA NEGATIVE NEGATIVE mg/dL   Hgb urine dipstick LARGE (A) NEGATIVE   Bilirubin Urine NEGATIVE NEGATIVE   Ketones, ur 80 (A) NEGATIVE mg/dL   Protein, ur 829 (A) NEGATIVE mg/dL   Nitrite NEGATIVE NEGATIVE   Leukocytes,Ua MODERATE (A) NEGATIVE   RBC / HPF 11-20 0 - 5 RBC/hpf   WBC, UA >50 0 - 5 WBC/hpf   Bacteria, UA RARE (A) NONE SEEN   Squamous Epithelial / HPF 0-5 0 - 5 /HPF  Pregnancy, urine     Status: None   Collection Time: 02/11/23 11:34 AM  Result Value Ref Range   Preg Test, Ur NEGATIVE NEGATIVE  Rapid urine drug screen (hospital performed)     Status: Abnormal   Collection Time: 02/11/23 11:34 AM  Result Value Ref Range   Opiates NONE DETECTED NONE DETECTED   Cocaine NONE DETECTED NONE DETECTED   Benzodiazepines NONE DETECTED NONE DETECTED   Amphetamines POSITIVE (A) NONE DETECTED   Tetrahydrocannabinol POSITIVE (A) NONE DETECTED   Barbiturates NONE DETECTED NONE DETECTED  Wet prep, genital     Status: Abnormal   Collection Time: 02/11/23  1:50 PM   Specimen: Vaginal  Result Value Ref Range   Yeast Wet Prep HPF POC NONE SEEN NONE SEEN   Trich, Wet Prep NONE SEEN NONE SEEN   Clue Cells Wet Prep HPF POC PRESENT (A) NONE SEEN   WBC, Wet Prep HPF POC >=10 (A) <10   Sperm NONE SEEN     US Pelvis Complete  Result Date: 02/11/2023 CLINICAL DATA:  27 year old with low abdominal and pelvic pain. Abnormal CT suspicious for bilateral pyosalpinx. EXAM: TRANSABDOMINAL ULTRASOUND OF PELVIS TECHNIQUE: Transabdominal ultrasound examination of the pelvis was performed including evaluation of the uterus,  ovaries, adnexal regions, and pelvic cul-de-sac. COMPARISON:  Abdominopelvic CT 02/11/2023 and 02/28/2019. FINDINGS: Uterus Measurements: 9.1 x 4.3 x 4.6 cm = volume: 94.7 mL. No fibroids or other mass visualized. Endometrium Thickness: 6 mm. No focal abnormality  identified on limited transabdominal imaging. Right ovary Tubular hypoechoic structure noted in the right adnexa as seen on preceding CT, consistent with a hydrosalpinx or pyosalpinx. This was not measured on this transabdominal examination, although measured up to 11.8 cm on the earlier CT. The right ovary is not clearly visualized. Left ovary Tubular hypoechoic structure noted in the left adnexa as seen on previous CT, consistent with a hydrosalpinx or pyosalpinx. This was not measured on this transabdominal examination, although measured up to 9.3 cm on CT. The left ureter is not clearly visualized. Other findings: Small amount of free pelvic fluid. Examination limited by pelvic bowel gas and the absence of transvaginal imaging (patient declined the transvaginal study). IMPRESSION: 1. Although suboptimally evaluated on transabdominal imaging, there are tubular hypoechoic structures in the adnexa bilaterally as seen on preceding CT, consistent with bilateral hydrosalpinx or pyosalpinx (pyosalpinx favored by CT appearance and the patient's leukocytosis). Correlate clinically. 2. The ovaries were not specifically visualized. The uterus appears unremarkable. 3. Small amount of free pelvic fluid. Electronically Signed   By: Carey Bullocks M.D.   On: 02/11/2023 15:08   CT ABDOMEN PELVIS W CONTRAST  Result Date: 02/11/2023 CLINICAL DATA:  Abdominal pain EXAM: CT ABDOMEN AND PELVIS WITH CONTRAST TECHNIQUE: Multidetector CT imaging of the abdomen and pelvis was performed using the standard protocol following bolus administration of intravenous contrast. RADIATION DOSE REDUCTION: This exam was performed according to the departmental dose-optimization program  which includes automated exposure control, adjustment of the mA and/or kV according to patient size and/or use of iterative reconstruction technique. CONTRAST:  OMNIPAQUE IOHEXOL 300 MG/ML  SOLN COMPARISON:  CT abdomen and pelvis dated January 29, 2019 FINDINGS: Lower chest: No acute abnormality. Hepatobiliary: No focal liver abnormality is seen. No gallstones, gallbladder wall thickening, or biliary dilatation. Pancreas: Unremarkable. No pancreatic ductal dilatation or surrounding inflammatory changes. Spleen: Unchanged mild splenomegaly. Adrenals/Urinary Tract: Bilateral adrenal glands are unremarkable. Mild heterogeneous enhancement of the left-greater-than-right kidneys. Marked bladder wall thickening and perivesicular stranding. Stomach/Bowel: Stomach is within normal limits. Appendix appears normal. No evidence of bowel wall thickening, distention, or inflammatory changes. Vascular/Lymphatic: No significant vascular findings are present. No enlarged abdominal or pelvic lymph nodes. Reproductive: Edematous appearance of the uterus with adjacent stranding. Tubular fluid-filled structures of the bilateral adnexa with a more multiloculated appearance on the left. Pelvic fat stranding. Other: No abdominal wall hernia or abnormality. Small volume pelvic free fluid. Musculoskeletal: No acute or significant osseous findings. IMPRESSION: 1. Tubular fluid-filled structures of the bilateral adnexa, concerning for pyosalpinx. Recommend further evaluation with pelvic ultrasound. 2. Marked bladder wall thickening with perivesicular fat stranding, consistent with cystitis. 3. Mild heterogeneous enhancement of the left-greater-than-right kidneys, concerning for associated ascending infection. 4. Mild wall thickening of several small and large bowel loops in the lower abdomen, likely reactive. 5. Small volume pelvic free fluid. Electronically Signed   By: Allegra Lai M.D.   On: 02/11/2023 13:03     Assessment/Plan: Bilateral hydrosalpinges vs.pyosalpinges with possible UTI treated with Flagyl Rocephin and doxycycline due to suspected PID. If she does not respond well to IV ABX consider Ir consult for drainage.   Scheryl Darter 02/11/2023, 9:31 PM

## 2023-02-11 NOTE — ED Triage Notes (Signed)
Pt states she ate crab legs and pizza on Sunday night and woke up Monday am with back pain; pt yesterday the pain moved to her abdomen and she is having watery diarrhea  Pt denies any urinary sx

## 2023-02-11 NOTE — ED Notes (Signed)
Urine collected and walked to the lab

## 2023-02-11 NOTE — ED Provider Notes (Signed)
EMERGENCY DEPARTMENT AT [facility] Provider Note   CSN: [id number] Arrival date & time: 02/11/23  8119     History  Chief Complaint  Patient presents with   Abdominal Pain    Sara Gill is a 27 y.o. female.  Pt c/o general abd pan in past couple days with nausea, and loose bms last night. No known bad food ingestio nor ill contacts. No recent antibiotic use or new meds (except did take laxative last evening). No bloody or bilious emesis. No abd distension. Last period ~ 3 weeks ago, normal. No vaginal bleeding or discharge. No dysuria. No fever or chills.   The history is provided by the patient and medical records.  Abdominal Pain Associated symptoms: nausea   Associated symptoms: no chest pain, no chills, no cough, no dysuria, no fever, no shortness of breath, no sore throat, no vaginal bleeding and no vaginal discharge        Home Medications Prior to Admission medications   Medication Sig Start Date End Date Taking? Authorizing Provider  albuterol (PROVENTIL HFA;VENTOLIN HFA) 108 (90 Base) MCG/ACT inhaler Inhale 2 puffs into the lungs every 6 (six) hours as needed for wheezing or shortness of breath. Shortness of breath/wheezing 08/20/18  Yes Doreene Eland, MD      Allergies    Patient has no known allergies.    Review of Systems   Review of Systems  Constitutional:  Negative for chills and fever.  HENT:  Negative for sore throat.   Eyes:  Negative for redness.  Respiratory:  Negative for cough and shortness of breath.   Cardiovascular:  Negative for chest pain.  Gastrointestinal:  Positive for abdominal pain and nausea.  Genitourinary:  Negative for dysuria, flank pain, vaginal bleeding and vaginal discharge.  Musculoskeletal:  Negative for myalgias.  Skin:  Negative for rash.  Neurological:  Negative for headaches.  Hematological:  Does not bruise/bleed easily.  Psychiatric/Behavioral:  Negative for confusion.     Physical  Exam Updated Vital Signs BP 120/71 (BP Location: Left Arm)   Pulse (!) 120   Temp 98.7 F (37.1 C) (Oral)   Resp 18   Ht 1.727 m (5\' 8" )   Wt 95.3 kg   LMP 01/22/2023   SpO2 99%   BMI 31.93 kg/m  Physical Exam Vitals and nursing note reviewed.  Constitutional:      Appearance: Normal appearance. She is well-developed.  HENT:     Head: Atraumatic.     Nose: Nose normal.     Mouth/Throat:     Mouth: Mucous membranes are moist.  Eyes:     General: No scleral icterus.    Conjunctiva/sclera: Conjunctivae normal.  Neck:     Trachea: No tracheal deviation.  Cardiovascular:     Rate and Rhythm: Normal rate and regular rhythm.     Pulses: Normal pulses.     Heart sounds: Normal heart sounds. No murmur heard.    No friction rub. No gallop.  Pulmonary:     Effort: Pulmonary effort is normal. No respiratory distress.     Breath sounds: Normal breath sounds.  Abdominal:     General: Bowel sounds are normal. There is no distension.     Palpations: Abdomen is soft. There is no mass.     Tenderness: There is abdominal tenderness. There is no guarding or rebound.     Hernia: No hernia is present.     Comments: Mild epigastric tenderness.   Genitourinary:  Comments: No cva tenderness. Chaperoned pelvic exam w nurse - scant bleeding, dark blood (pt indicates period just started), no purulent discharge, equivocal CMT, no focal adx mass or focal adnexal tenderness.  Musculoskeletal:        General: No swelling or tenderness.     Cervical back: Normal range of motion and neck supple. No rigidity. No muscular tenderness.  Skin:    General: Skin is warm and dry.     Findings: No rash.  Neurological:     Mental Status: She is alert.     Comments: Alert, speech normal. Steady gait.   Psychiatric:        Mood and Affect: Mood normal.     ED Results / Procedures / Treatments   Labs (all labs ordered are listed, but only abnormal results are displayed) Results for orders placed or  performed during the hospital encounter of 02/11/23  Wet prep, genital   Specimen: Vaginal  Result Value Ref Range   Yeast Wet Prep HPF POC NONE SEEN NONE SEEN   Trich, Wet Prep NONE SEEN NONE SEEN   Clue Cells Wet Prep HPF POC PRESENT (A) NONE SEEN   WBC, Wet Prep HPF POC >=10 (A) <10   Sperm NONE SEEN   CBC  Result Value Ref Range   WBC 18.7 (H) 4.0 - 10.5 K/uL   RBC 4.50 3.87 - 5.11 MIL/uL   Hemoglobin 11.9 (L) 12.0 - 15.0 g/dL   HCT 09.8 11.9 - 14.7 %   MCV 83.3 80.0 - 100.0 fL   MCH 26.4 26.0 - 34.0 pg   MCHC 31.7 30.0 - 36.0 g/dL   RDW 82.9 (H) 56.2 - 13.0 %   Platelets 216 150 - 400 K/uL   nRBC 0.0 0.0 - 0.2 %  Urinalysis, Routine w reflex microscopic -Urine, Clean Catch  Result Value Ref Range   Color, Urine YELLOW YELLOW   APPearance HAZY (A) CLEAR   Specific Gravity, Urine 1.013 1.005 - 1.030   pH 6.0 5.0 - 8.0   Glucose, UA NEGATIVE NEGATIVE mg/dL   Hgb urine dipstick LARGE (A) NEGATIVE   Bilirubin Urine NEGATIVE NEGATIVE   Ketones, ur 80 (A) NEGATIVE mg/dL   Protein, ur 865 (A) NEGATIVE mg/dL   Nitrite NEGATIVE NEGATIVE   Leukocytes,Ua MODERATE (A) NEGATIVE   RBC / HPF 11-20 0 - 5 RBC/hpf   WBC, UA >50 0 - 5 WBC/hpf   Bacteria, UA RARE (A) NONE SEEN   Squamous Epithelial / HPF 0-5 0 - 5 /HPF  Pregnancy, urine  Result Value Ref Range   Preg Test, Ur NEGATIVE NEGATIVE  Rapid urine drug screen (hospital performed)  Result Value Ref Range   Opiates NONE DETECTED NONE DETECTED   Cocaine NONE DETECTED NONE DETECTED   Benzodiazepines NONE DETECTED NONE DETECTED   Amphetamines POSITIVE (A) NONE DETECTED   Tetrahydrocannabinol POSITIVE (A) NONE DETECTED   Barbiturates NONE DETECTED NONE DETECTED  Comprehensive metabolic panel  Result Value Ref Range   Sodium 134 (L) 135 - 145 mmol/L   Potassium 3.2 (L) 3.5 - 5.1 mmol/L   Chloride 97 (L) 98 - 111 mmol/L   CO2 22 22 - 32 mmol/L   Glucose, Bld 99 70 - 99 mg/dL   BUN 6 6 - 20 mg/dL   Creatinine, Ser 7.84 0.44 -  1.00 mg/dL   Calcium 9.8 8.9 - 69.6 mg/dL   Total Protein 9.4 (H) 6.5 - 8.1 g/dL   Albumin 4.3 3.5 - 5.0 g/dL  AST 20 15 - 41 U/L   ALT 20 0 - 44 U/L   Alkaline Phosphatase 94 38 - 126 U/L   Total Bilirubin 1.2 0.3 - 1.2 mg/dL   GFR, Estimated >16 >10 mL/min   Anion gap 15 5 - 15  Lipase, blood  Result Value Ref Range   Lipase 27 11 - 51 U/L     EKG None  Radiology US Pelvis Complete  Result Date: 02/11/2023 CLINICAL DATA:  27 year old with low abdominal and pelvic pain. Abnormal CT suspicious for bilateral pyosalpinx. EXAM: TRANSABDOMINAL ULTRASOUND OF PELVIS TECHNIQUE: Transabdominal ultrasound examination of the pelvis was performed including evaluation of the uterus, ovaries, adnexal regions, and pelvic cul-de-sac. COMPARISON:  Abdominopelvic CT 02/11/2023 and 02/28/2019. FINDINGS: Uterus Measurements: 9.1 x 4.3 x 4.6 cm = volume: 94.7 mL. No fibroids or other mass visualized. Endometrium Thickness: 6 mm. No focal abnormality identified on limited transabdominal imaging. Right ovary Tubular hypoechoic structure noted in the right adnexa as seen on preceding CT, consistent with a hydrosalpinx or pyosalpinx. This was not measured on this transabdominal examination, although measured up to 11.8 cm on the earlier CT. The right ovary is not clearly visualized. Left ovary Tubular hypoechoic structure noted in the left adnexa as seen on previous CT, consistent with a hydrosalpinx or pyosalpinx. This was not measured on this transabdominal examination, although measured up to 9.3 cm on CT. The left ureter is not clearly visualized. Other findings: Small amount of free pelvic fluid. Examination limited by pelvic bowel gas and the absence of transvaginal imaging (patient declined the transvaginal study). IMPRESSION: 1. Although suboptimally evaluated on transabdominal imaging, there are tubular hypoechoic structures in the adnexa bilaterally as seen on preceding CT, consistent with bilateral  hydrosalpinx or pyosalpinx (pyosalpinx favored by CT appearance and the patient's leukocytosis). Correlate clinically. 2. The ovaries were not specifically visualized. The uterus appears unremarkable. 3. Small amount of free pelvic fluid. Electronically Signed   By: Carey Bullocks M.D.   On: 02/11/2023 15:08   CT ABDOMEN PELVIS W CONTRAST  Result Date: 02/11/2023 CLINICAL DATA:  Abdominal pain EXAM: CT ABDOMEN AND PELVIS WITH CONTRAST TECHNIQUE: Multidetector CT imaging of the abdomen and pelvis was performed using the standard protocol following bolus administration of intravenous contrast. RADIATION DOSE REDUCTION: This exam was performed according to the departmental dose-optimization program which includes automated exposure control, adjustment of the mA and/or kV according to patient size and/or use of iterative reconstruction technique. CONTRAST:  OMNIPAQUE IOHEXOL 300 MG/ML  SOLN COMPARISON:  CT abdomen and pelvis dated January 29, 2019 FINDINGS: Lower chest: No acute abnormality. Hepatobiliary: No focal liver abnormality is seen. No gallstones, gallbladder wall thickening, or biliary dilatation. Pancreas: Unremarkable. No pancreatic ductal dilatation or surrounding inflammatory changes. Spleen: Unchanged mild splenomegaly. Adrenals/Urinary Tract: Bilateral adrenal glands are unremarkable. Mild heterogeneous enhancement of the left-greater-than-right kidneys. Marked bladder wall thickening and perivesicular stranding. Stomach/Bowel: Stomach is within normal limits. Appendix appears normal. No evidence of bowel wall thickening, distention, or inflammatory changes. Vascular/Lymphatic: No significant vascular findings are present. No enlarged abdominal or pelvic lymph nodes. Reproductive: Edematous appearance of the uterus with adjacent stranding. Tubular fluid-filled structures of the bilateral adnexa with a more multiloculated appearance on the left. Pelvic fat stranding. Other: No abdominal wall hernia  or abnormality. Small volume pelvic free fluid. Musculoskeletal: No acute or significant osseous findings. IMPRESSION: 1. Tubular fluid-filled structures of the bilateral adnexa, concerning for pyosalpinx. Recommend further evaluation with pelvic ultrasound. 2. Marked bladder wall thickening  with perivesicular fat stranding, consistent with cystitis. 3. Mild heterogeneous enhancement of the left-greater-than-right kidneys, concerning for associated ascending infection. 4. Mild wall thickening of several small and large bowel loops in the lower abdomen, likely reactive. 5. Small volume pelvic free fluid. Electronically Signed   By: Allegra Lai M.D.   On: 02/11/2023 13:03    Procedures Procedures    Medications Ordered in ED Medications  ondansetron (ZOFRAN) injection 4 mg (0 mg Intravenous Hold 02/11/23 0922)  lactated ringers bolus 1,000 mL (has no administration in time range)  ibuprofen (ADVIL) tablet 400 mg (has no administration in time range)  doxycycline (VIBRAMYCIN) 100 mg in sodium chloride 0.9 % 250 mL IVPB (has no administration in time range)  metroNIDAZOLE (FLAGYL) IVPB 500 mg (has no administration in time range)  lactated ringers bolus 1,000 mL (0 mLs Intravenous Stopping previously hung infusion 02/11/23 1042)  HYDROmorphone (DILAUDID) injection 0.5 mg (0.5 mg Intravenous Given 02/11/23 1042)  cefTRIAXone (ROCEPHIN) 1 g in sodium chloride 0.9 % 100 mL IVPB (0 g Intravenous Stopped 02/11/23 1327)  iohexol (OMNIPAQUE) 300 MG/ML solution 100 mL (100 mLs Intravenous Contrast Given 02/11/23 1233)  acetaminophen (TYLENOL) tablet 1,000 mg (1,000 mg Oral Given 02/11/23 1434)  potassium chloride SA (KLOR-CON M) CR tablet 40 mEq (40 mEq Oral Given 02/11/23 1435)    ED Course/ Medical Decision Making/ A&P                             Medical Decision Making Problems Addressed: Acute pyelonephritis: acute illness or injury with systemic symptoms that poses a threat to life or bodily  functions Hypokalemia: acute illness or injury Lower abdominal pain: acute illness or injury with systemic symptoms that poses a threat to life or bodily functions    Details: R/o PID Pyosalpinx: acute illness or injury with systemic symptoms that poses a threat to life or bodily functions  Amount and/or Complexity of Data Reviewed External Data Reviewed: notes. Labs: ordered. Decision-making details documented in ED Course. Radiology: ordered and independent interpretation performed. Decision-making details documented in ED Course. Discussion of management or test interpretation with external provider(s): Gynecology, discussed pt, labs and imaging.   Risk OTC drugs. Prescription drug management. Parenteral controlled substances. Decision regarding hospitalization.   Iv ns. Continuous pulse ox and cardiac monitoring. Labs ordered/sent. Imaging ordered.   Differential diagnosis includes gastritis, pud, gallstones, pancreatitis, CHS, etc. Dispo decision including potential need for admission considered - will get labs and imaging and reassess.   Reviewed nursing notes and prior charts for additional history. External reports reviewed.  Cardiac monitor: sinus rhythm, rate 110.  LR bolus. Zofran iv. Dilaudid iv.   Labs reviewed/interpreted by me - wbc high, k midlly low, kcl po. UA w 50 wbc, will culture and treat. Rocephin iv.   CT reviewed/interpreted by me - ?pyelo/uti. Radiology rec u/s pelvis.  Doxy and flagyl iv.   U/s reviewed/interpreted by me  - ?pyosalpinx.   GYN consulted - discussed pt - they will call back with dispo plan.   1533, GYN call back pending - signed out to Dr Estell Harpin that call back pending (possible admission to [facility] hospitalist with GYN being consulted).          Final Clinical Impression(s) / ED Diagnoses Final diagnoses:  Acute pyelonephritis  Lower abdominal pain    Rx / DC Orders ED Discharge Orders     None  Cathren Laine,  MD 02/11/23 [phone]

## 2023-02-12 LAB — GC/CHLAMYDIA PROBE AMP (~~LOC~~) NOT AT ARMC
Chlamydia: NEGATIVE
Comment: NEGATIVE
Comment: NORMAL
Neisseria Gonorrhea: NEGATIVE

## 2023-02-12 LAB — CBC WITH DIFFERENTIAL/PLATELET
Abs Immature Granulocytes: 0.08 10*3/uL — ABNORMAL HIGH (ref 0.00–0.07)
Basophils Absolute: 0.1 10*3/uL (ref 0.0–0.1)
Basophils Relative: 0 %
Eosinophils Absolute: 0.1 10*3/uL (ref 0.0–0.5)
Eosinophils Relative: 0 %
HCT: 35.1 % — ABNORMAL LOW (ref 36.0–46.0)
Hemoglobin: 10.9 g/dL — ABNORMAL LOW (ref 12.0–15.0)
Immature Granulocytes: 1 %
Lymphocytes Relative: 12 %
Lymphs Abs: 1.8 10*3/uL (ref 0.7–4.0)
MCH: 26.3 pg (ref 26.0–34.0)
MCHC: 31.1 g/dL (ref 30.0–36.0)
MCV: 84.6 fL (ref 80.0–100.0)
Monocytes Absolute: 0.8 10*3/uL (ref 0.1–1.0)
Monocytes Relative: 6 %
Neutro Abs: 11.5 10*3/uL — ABNORMAL HIGH (ref 1.7–7.7)
Neutrophils Relative %: 81 %
Platelets: 195 10*3/uL (ref 150–400)
RBC: 4.15 MIL/uL (ref 3.87–5.11)
RDW: 17.5 % — ABNORMAL HIGH (ref 11.5–15.5)
WBC: 14.3 10*3/uL — ABNORMAL HIGH (ref 4.0–10.5)
nRBC: 0 % (ref 0.0–0.2)

## 2023-02-12 LAB — URINE CULTURE

## 2023-02-12 MED ORDER — SODIUM CHLORIDE 0.9 % IV SOLN
2.0000 g | INTRAVENOUS | Status: DC
  Administered 2023-02-12 – 2023-02-14 (×3): 2 g via INTRAVENOUS
  Filled 2023-02-12 (×3): qty 20

## 2023-02-12 MED ORDER — SODIUM CHLORIDE 0.9 % IV SOLN
100.0000 mg | Freq: Two times a day (BID) | INTRAVENOUS | Status: DC
  Administered 2023-02-12 – 2023-02-14 (×5): 100 mg via INTRAVENOUS
  Filled 2023-02-12 (×5): qty 100

## 2023-02-12 MED ORDER — METRONIDAZOLE 500 MG/100ML IV SOLN
500.0000 mg | Freq: Two times a day (BID) | INTRAVENOUS | Status: DC
  Administered 2023-02-12 – 2023-02-14 (×5): 500 mg via INTRAVENOUS
  Filled 2023-02-12 (×5): qty 100

## 2023-02-12 NOTE — Progress Notes (Signed)
Some vaginal bleeding noted per pt, peri-pads and mesh underwear given to pt. Pt educated to keep track of # of pads being used. Will monitor

## 2023-02-12 NOTE — Progress Notes (Signed)
Patient ID: Sara Gill, female   DOB: 06-15-1996, 27 y.o.   MRN: [id number]   Assessment/Plan: Principal Problem:   PID (acute pelvic inflammatory disease)  Continue inpatient status WBC has dropped from 18-14 today, repeat CBC in the morning IV antibiotics Rocephin/doxycycline/Flagyl Await GC and Chlamydia cultures Urine culture is negative so suspect findings on CT suggestive of UTI or actually PID with subsequent inflammation of the bladder. Should we fail to see continued improvement consider IR drainage of her bilateral hydro/hydrosalpinges  Subjective: Interval History: Patient reports feeling some better.  Her abdominal pain has improved slightly.  She has not received antibiotics since yesterday in the ED.  Objective: Vital signs in last 24 hours: Temp:  [98.4 F (36.9 C)-99.6 F (37.6 C)] 98.4 F (36.9 C) (06/21 0821) Pulse Rate:  [98-120] 107 (06/21 0524) Resp:  [17-20] 18 (06/21 0821) BP: (120-153)/(71-82) 133/82 (06/21 0821) SpO2:  [98 %-100 %] 100 % (06/21 0821)  Intake/Output from previous day: No intake/output data recorded. Intake/Output this shift: No intake/output data recorded.  General appearance: alert, cooperative, and appears stated age Head: Normocephalic, without obvious abnormality, atraumatic Lungs: Normal effort Heart: regular rate and rhythm Abdomen: Soft, exquisitely tender in the lower quadrants bilaterally, positive rebound  Results for orders placed or performed during the hospital encounter of 02/11/23 (from the past 24 hour(s))  Urinalysis, Routine w reflex microscopic -Urine, Clean Catch     Status: Abnormal   Collection Time: 02/11/23 11:34 AM  Result Value Ref Range   Color, Urine YELLOW YELLOW   APPearance HAZY (A) CLEAR   Specific Gravity, Urine 1.013 1.005 - 1.030   pH 6.0 5.0 - 8.0   Glucose, UA NEGATIVE NEGATIVE mg/dL   Hgb urine dipstick LARGE (A) NEGATIVE   Bilirubin Urine NEGATIVE NEGATIVE   Ketones, ur 80 (A)  NEGATIVE mg/dL   Protein, ur 694 (A) NEGATIVE mg/dL   Nitrite NEGATIVE NEGATIVE   Leukocytes,Ua MODERATE (A) NEGATIVE   RBC / HPF 11-20 0 - 5 RBC/hpf   WBC, UA >50 0 - 5 WBC/hpf   Bacteria, UA RARE (A) NONE SEEN   Squamous Epithelial / HPF 0-5 0 - 5 /HPF  Pregnancy, urine     Status: None   Collection Time: 02/11/23 11:34 AM  Result Value Ref Range   Preg Test, Ur NEGATIVE NEGATIVE  Rapid urine drug screen (hospital performed)     Status: Abnormal   Collection Time: 02/11/23 11:34 AM  Result Value Ref Range   Opiates NONE DETECTED NONE DETECTED   Cocaine NONE DETECTED NONE DETECTED   Benzodiazepines NONE DETECTED NONE DETECTED   Amphetamines POSITIVE (A) NONE DETECTED   Tetrahydrocannabinol POSITIVE (A) NONE DETECTED   Barbiturates NONE DETECTED NONE DETECTED  Urine Culture     Status: Abnormal   Collection Time: 02/11/23 11:34 AM   Specimen: Urine, Clean Catch  Result Value Ref Range   Specimen Description      URINE, CLEAN CATCH Performed at [facility], [address] [place], [place] [address]    Special Requests      NONE Performed at [facility], [address], [place], [place] [address]    Culture MULTIPLE SPECIES PRESENT, SUGGEST RECOLLECTION (A)    Report Status 02/12/2023 FINAL   Wet prep, genital     Status: Abnormal   Collection Time: 02/11/23  1:50 PM   Specimen: Vaginal  Result Value Ref Range   Yeast Wet Prep HPF POC NONE SEEN NONE SEEN   Trich, Wet  Prep NONE SEEN NONE SEEN   Clue Cells Wet Prep HPF POC PRESENT (A) NONE SEEN   WBC, Wet Prep HPF POC >=10 (A) <10   Sperm NONE SEEN   CBC with Differential/Platelet     Status: Abnormal   Collection Time: 02/12/23 10:30 AM  Result Value Ref Range   WBC 14.3 (H) 4.0 - 10.5 K/uL   RBC 4.15 3.87 - 5.11 MIL/uL   Hemoglobin 10.9 (L) 12.0 - 15.0 g/dL   HCT 16.1 (L) 09.6 - 04.5 %   MCV 84.6 80.0 - 100.0 fL   MCH 26.3 26.0 - 34.0 pg   MCHC 31.1 30.0 - 36.0 g/dL   RDW 40.9 (H) 81.1 - 91.4 %   Platelets 195  150 - 400 K/uL   nRBC 0.0 0.0 - 0.2 %   Neutrophils Relative % 81 %   Neutro Abs 11.5 (H) 1.7 - 7.7 K/uL   Lymphocytes Relative 12 %   Lymphs Abs 1.8 0.7 - 4.0 K/uL   Monocytes Relative 6 %   Monocytes Absolute 0.8 0.1 - 1.0 K/uL   Eosinophils Relative 0 %   Eosinophils Absolute 0.1 0.0 - 0.5 K/uL   Basophils Relative 0 %   Basophils Absolute 0.1 0.0 - 0.1 K/uL   Immature Granulocytes 1 %   Abs Immature Granulocytes 0.08 (H) 0.00 - 0.07 K/uL    Studies/Results: US Pelvis Complete  Result Date: 02/11/2023 CLINICAL DATA:  27 year old with low abdominal and pelvic pain. Abnormal CT suspicious for bilateral pyosalpinx. EXAM: TRANSABDOMINAL ULTRASOUND OF PELVIS TECHNIQUE: Transabdominal ultrasound examination of the pelvis was performed including evaluation of the uterus, ovaries, adnexal regions, and pelvic cul-de-sac. COMPARISON:  Abdominopelvic CT 02/11/2023 and 02/28/2019. FINDINGS: Uterus Measurements: 9.1 x 4.3 x 4.6 cm = volume: 94.7 mL. No fibroids or other mass visualized. Endometrium Thickness: 6 mm. No focal abnormality identified on limited transabdominal imaging. Right ovary Tubular hypoechoic structure noted in the right adnexa as seen on preceding CT, consistent with a hydrosalpinx or pyosalpinx. This was not measured on this transabdominal examination, although measured up to 11.8 cm on the earlier CT. The right ovary is not clearly visualized. Left ovary Tubular hypoechoic structure noted in the left adnexa as seen on previous CT, consistent with a hydrosalpinx or pyosalpinx. This was not measured on this transabdominal examination, although measured up to 9.3 cm on CT. The left ureter is not clearly visualized. Other findings: Small amount of free pelvic fluid. Examination limited by pelvic bowel gas and the absence of transvaginal imaging (patient declined the transvaginal study). IMPRESSION: 1. Although suboptimally evaluated on transabdominal imaging, there are tubular hypoechoic  structures in the adnexa bilaterally as seen on preceding CT, consistent with bilateral hydrosalpinx or pyosalpinx (pyosalpinx favored by CT appearance and the patient's leukocytosis). Correlate clinically. 2. The ovaries were not specifically visualized. The uterus appears unremarkable. 3. Small amount of free pelvic fluid. Electronically Signed   By: Carey Bullocks M.D.   On: 02/11/2023 15:08   CT ABDOMEN PELVIS W CONTRAST  Result Date: 02/11/2023 CLINICAL DATA:  Abdominal pain EXAM: CT ABDOMEN AND PELVIS WITH CONTRAST TECHNIQUE: Multidetector CT imaging of the abdomen and pelvis was performed using the standard protocol following bolus administration of intravenous contrast. RADIATION DOSE REDUCTION: This exam was performed according to the departmental dose-optimization program which includes automated exposure control, adjustment of the mA and/or kV according to patient size and/or use of iterative reconstruction technique. CONTRAST:  OMNIPAQUE IOHEXOL 300 MG/ML  SOLN COMPARISON:  CT abdomen and pelvis dated January 29, 2019 FINDINGS: Lower chest: No acute abnormality. Hepatobiliary: No focal liver abnormality is seen. No gallstones, gallbladder wall thickening, or biliary dilatation. Pancreas: Unremarkable. No pancreatic ductal dilatation or surrounding inflammatory changes. Spleen: Unchanged mild splenomegaly. Adrenals/Urinary Tract: Bilateral adrenal glands are unremarkable. Mild heterogeneous enhancement of the left-greater-than-right kidneys. Marked bladder wall thickening and perivesicular stranding. Stomach/Bowel: Stomach is within normal limits. Appendix appears normal. No evidence of bowel wall thickening, distention, or inflammatory changes. Vascular/Lymphatic: No significant vascular findings are present. No enlarged abdominal or pelvic lymph nodes. Reproductive: Edematous appearance of the uterus with adjacent stranding. Tubular fluid-filled structures of the bilateral adnexa with a more  multiloculated appearance on the left. Pelvic fat stranding. Other: No abdominal wall hernia or abnormality. Small volume pelvic free fluid. Musculoskeletal: No acute or significant osseous findings. IMPRESSION: 1. Tubular fluid-filled structures of the bilateral adnexa, concerning for pyosalpinx. Recommend further evaluation with pelvic ultrasound. 2. Marked bladder wall thickening with perivesicular fat stranding, consistent with cystitis. 3. Mild heterogeneous enhancement of the left-greater-than-right kidneys, concerning for associated ascending infection. 4. Mild wall thickening of several small and large bowel loops in the lower abdomen, likely reactive. 5. Small volume pelvic free fluid. Electronically Signed   By: Allegra Lai M.D.   On: 02/11/2023 13:03    Scheduled Meds:  ondansetron (ZOFRAN) IV  4 mg Intravenous Once   Continuous Infusions:  cefTRIAXone (ROCEPHIN)  IV     doxycycline (VIBRAMYCIN) IV     metroNIDAZOLE     PRN Meds:ibuprofen, oxyCODONE-acetaminophen, zolpidem    LOS: 1 day   Reva Bores, MD 02/12/2023 11:21 AM

## 2023-02-12 NOTE — Progress Notes (Signed)
Pt c/o 5/10 abdominal pain, stated it was tolerable, MD notified, analgesics ordered, pt asleep at this time, will monitor closely and assess pain frequently

## 2023-02-13 LAB — CBC
HCT: 33.4 % — ABNORMAL LOW (ref 36.0–46.0)
Hemoglobin: 10.4 g/dL — ABNORMAL LOW (ref 12.0–15.0)
MCH: 26 pg (ref 26.0–34.0)
MCHC: 31.1 g/dL (ref 30.0–36.0)
MCV: 83.5 fL (ref 80.0–100.0)
Platelets: 185 10*3/uL (ref 150–400)
RBC: 4 MIL/uL (ref 3.87–5.11)
RDW: 17.6 % — ABNORMAL HIGH (ref 11.5–15.5)
WBC: 11.5 10*3/uL — ABNORMAL HIGH (ref 4.0–10.5)
nRBC: 0 % (ref 0.0–0.2)

## 2023-02-13 MED ORDER — POLYETHYLENE GLYCOL 3350 17 G PO PACK
17.0000 g | PACK | Freq: Every day | ORAL | Status: DC
  Administered 2023-02-14: 17 g via ORAL
  Filled 2023-02-13: qty 1

## 2023-02-13 NOTE — Progress Notes (Signed)
Patient ID: Sara Gill, female   DOB: 10-30-1995, 27 y.o.   MRN: [id number]   Assessment/Plan: Principal Problem:   PID (acute pelvic inflammatory disease)  WBC has dropped to 11.5 today IV Rocephin/Doxy/Flagyl GC/Chlam negative Significant improvement in exam today. One more day of IV Abx and if improvement continues, DC home in am Miralax for constipation   Subjective: Interval History:Having constipation. Belly pain has significantly improved.  Objective: Vital signs in last 24 hours: Temp:  [98.2 F (36.8 C)-99.2 F (37.3 C)] 98.2 F (36.8 C) (06/22 0831) Pulse Rate:  [86-109] 100 (06/22 0831) Resp:  [16-18] 17 (06/22 0831) BP: (122-128)/(72-79) 127/73 (06/22 0831) SpO2:  [98 %-100 %] 98 % (06/22 0831)  Intake/Output from previous day: 06/21 0701 - 06/22 0700 In: 450  Out: -  Intake/Output this shift: Total I/O In: 250 [IV Piggyback:250] Out: -   General appearance: alert, cooperative, and appears stated age Head: Normocephalic, without obvious abnormality, atraumatic Lungs: normal effort Heart: regular rate and rhythm Abdomen: soft, non-tender  Results for orders placed or performed during the hospital encounter of 02/11/23 (from the past 24 hour(s))  CBC with Differential/Platelet     Status: Abnormal   Collection Time: 02/12/23 10:30 AM  Result Value Ref Range   WBC 14.3 (H) 4.0 - 10.5 K/uL   RBC 4.15 3.87 - 5.11 MIL/uL   Hemoglobin 10.9 (L) 12.0 - 15.0 g/dL   HCT 78.2 (L) 95.6 - 21.3 %   MCV 84.6 80.0 - 100.0 fL   MCH 26.3 26.0 - 34.0 pg   MCHC 31.1 30.0 - 36.0 g/dL   RDW 08.6 (H) 57.8 - 46.9 %   Platelets 195 150 - 400 K/uL   nRBC 0.0 0.0 - 0.2 %   Neutrophils Relative % 81 %   Neutro Abs 11.5 (H) 1.7 - 7.7 K/uL   Lymphocytes Relative 12 %   Lymphs Abs 1.8 0.7 - 4.0 K/uL   Monocytes Relative 6 %   Monocytes Absolute 0.8 0.1 - 1.0 K/uL   Eosinophils Relative 0 %   Eosinophils Absolute 0.1 0.0 - 0.5 K/uL   Basophils Relative 0 %    Basophils Absolute 0.1 0.0 - 0.1 K/uL   Immature Granulocytes 1 %   Abs Immature Granulocytes 0.08 (H) 0.00 - 0.07 K/uL  CBC     Status: Abnormal   Collection Time: 02/13/23 12:54 AM  Result Value Ref Range   WBC 11.5 (H) 4.0 - 10.5 K/uL   RBC 4.00 3.87 - 5.11 MIL/uL   Hemoglobin 10.4 (L) 12.0 - 15.0 g/dL   HCT 62.9 (L) 52.8 - 41.3 %   MCV 83.5 80.0 - 100.0 fL   MCH 26.0 26.0 - 34.0 pg   MCHC 31.1 30.0 - 36.0 g/dL   RDW 24.4 (H) 01.0 - 27.2 %   Platelets 185 150 - 400 K/uL   nRBC 0.0 0.0 - 0.2 %    Studies/Results: US Pelvis Complete  Result Date: 02/11/2023 CLINICAL DATA:  27 year old with low abdominal and pelvic pain. Abnormal CT suspicious for bilateral pyosalpinx. EXAM: TRANSABDOMINAL ULTRASOUND OF PELVIS TECHNIQUE: Transabdominal ultrasound examination of the pelvis was performed including evaluation of the uterus, ovaries, adnexal regions, and pelvic cul-de-sac. COMPARISON:  Abdominopelvic CT 02/11/2023 and 02/28/2019. FINDINGS: Uterus Measurements: 9.1 x 4.3 x 4.6 cm = volume: 94.7 mL. No fibroids or other mass visualized. Endometrium Thickness: 6 mm. No focal abnormality identified on limited transabdominal imaging. Right ovary Tubular hypoechoic structure noted in the right adnexa as  seen on preceding CT, consistent with a hydrosalpinx or pyosalpinx. This was not measured on this transabdominal examination, although measured up to 11.8 cm on the earlier CT. The right ovary is not clearly visualized. Left ovary Tubular hypoechoic structure noted in the left adnexa as seen on previous CT, consistent with a hydrosalpinx or pyosalpinx. This was not measured on this transabdominal examination, although measured up to 9.3 cm on CT. The left ureter is not clearly visualized. Other findings: Small amount of free pelvic fluid. Examination limited by pelvic bowel gas and the absence of transvaginal imaging (patient declined the transvaginal study). IMPRESSION: 1. Although suboptimally evaluated  on transabdominal imaging, there are tubular hypoechoic structures in the adnexa bilaterally as seen on preceding CT, consistent with bilateral hydrosalpinx or pyosalpinx (pyosalpinx favored by CT appearance and the patient's leukocytosis). Correlate clinically. 2. The ovaries were not specifically visualized. The uterus appears unremarkable. 3. Small amount of free pelvic fluid. Electronically Signed   By: Carey Bullocks M.D.   On: 02/11/2023 15:08   CT ABDOMEN PELVIS W CONTRAST  Result Date: 02/11/2023 CLINICAL DATA:  Abdominal pain EXAM: CT ABDOMEN AND PELVIS WITH CONTRAST TECHNIQUE: Multidetector CT imaging of the abdomen and pelvis was performed using the standard protocol following bolus administration of intravenous contrast. RADIATION DOSE REDUCTION: This exam was performed according to the departmental dose-optimization program which includes automated exposure control, adjustment of the mA and/or kV according to patient size and/or use of iterative reconstruction technique. CONTRAST:  OMNIPAQUE IOHEXOL 300 MG/ML  SOLN COMPARISON:  CT abdomen and pelvis dated January 29, 2019 FINDINGS: Lower chest: No acute abnormality. Hepatobiliary: No focal liver abnormality is seen. No gallstones, gallbladder wall thickening, or biliary dilatation. Pancreas: Unremarkable. No pancreatic ductal dilatation or surrounding inflammatory changes. Spleen: Unchanged mild splenomegaly. Adrenals/Urinary Tract: Bilateral adrenal glands are unremarkable. Mild heterogeneous enhancement of the left-greater-than-right kidneys. Marked bladder wall thickening and perivesicular stranding. Stomach/Bowel: Stomach is within normal limits. Appendix appears normal. No evidence of bowel wall thickening, distention, or inflammatory changes. Vascular/Lymphatic: No significant vascular findings are present. No enlarged abdominal or pelvic lymph nodes. Reproductive: Edematous appearance of the uterus with adjacent stranding. Tubular  fluid-filled structures of the bilateral adnexa with a more multiloculated appearance on the left. Pelvic fat stranding. Other: No abdominal wall hernia or abnormality. Small volume pelvic free fluid. Musculoskeletal: No acute or significant osseous findings. IMPRESSION: 1. Tubular fluid-filled structures of the bilateral adnexa, concerning for pyosalpinx. Recommend further evaluation with pelvic ultrasound. 2. Marked bladder wall thickening with perivesicular fat stranding, consistent with cystitis. 3. Mild heterogeneous enhancement of the left-greater-than-right kidneys, concerning for associated ascending infection. 4. Mild wall thickening of several small and large bowel loops in the lower abdomen, likely reactive. 5. Small volume pelvic free fluid. Electronically Signed   By: Allegra Lai M.D.   On: 02/11/2023 13:03    Scheduled Meds:  ondansetron (ZOFRAN) IV  4 mg Intravenous Once   polyethylene glycol  17 g Oral Daily   Continuous Infusions:  cefTRIAXone (ROCEPHIN)  IV Stopped (02/12/23 1225)   doxycycline (VIBRAMYCIN) IV Stopped (02/13/23 0144)   metroNIDAZOLE Stopped (02/12/23 2220)   PRN Meds:ibuprofen, oxyCODONE-acetaminophen, zolpidem    LOS: 2 days   Reva Bores, MD 02/13/2023 10:13 AM

## 2023-02-14 MED ORDER — METRONIDAZOLE 500 MG PO TABS: 500.0000 mg | ORAL_TABLET | Freq: Two times a day (BID) | ORAL | 0 refills | Status: AC

## 2023-02-14 MED ORDER — DOXYCYCLINE HYCLATE 100 MG PO CAPS: 100.0000 mg | ORAL_CAPSULE | Freq: Two times a day (BID) | ORAL | 0 refills | Status: AC

## 2023-02-14 NOTE — Discharge Summary (Signed)
Physician Discharge Summary  Patient ID: Sara Gill MRN: [id number] DOB/AGE: April 12, 1996 27 y.o.  Admit date: 02/11/2023 Discharge date: 02/14/2023   Discharge Diagnoses:  Principal Problem:   PID (acute pelvic inflammatory disease)   Consults: None  Significant Diagnostic Studies:  Results for orders placed or performed during the hospital encounter of 02/11/23 (from the past 48 hour(s))  CBC     Status: Abnormal   Collection Time: 02/13/23 12:54 AM  Result Value Ref Range   WBC 11.5 (H) 4.0 - 10.5 K/uL   RBC 4.00 3.87 - 5.11 MIL/uL   Hemoglobin 10.4 (L) 12.0 - 15.0 g/dL   HCT 81.1 (L) 91.4 - 78.2 %   MCV 83.5 80.0 - 100.0 fL   MCH 26.0 26.0 - 34.0 pg   MCHC 31.1 30.0 - 36.0 g/dL   RDW 95.6 (H) 21.3 - 08.6 %   Platelets 185 150 - 400 K/uL   nRBC 0.0 0.0 - 0.2 %    Comment: Performed at [facility], [address] [place], [place] [address]    US Pelvis Complete  Result Date: 02/11/2023 CLINICAL DATA:  27 year old with low abdominal and pelvic pain. Abnormal CT suspicious for bilateral pyosalpinx. EXAM: TRANSABDOMINAL ULTRASOUND OF PELVIS TECHNIQUE: Transabdominal ultrasound examination of the pelvis was performed including evaluation of the uterus, ovaries, adnexal regions, and pelvic cul-de-sac. COMPARISON:  Abdominopelvic CT 02/11/2023 and 02/28/2019. FINDINGS: Uterus Measurements: 9.1 x 4.3 x 4.6 cm = volume: 94.7 mL. No fibroids or other mass visualized. Endometrium Thickness: 6 mm. No focal abnormality identified on limited transabdominal imaging. Right ovary Tubular hypoechoic structure noted in the right adnexa as seen on preceding CT, consistent with a hydrosalpinx or pyosalpinx. This was not measured on this transabdominal examination, although measured up to 11.8 cm on the earlier CT. The right ovary is not clearly visualized. Left ovary Tubular hypoechoic structure noted in the left adnexa as seen on previous CT, consistent with a hydrosalpinx or pyosalpinx.  This was not measured on this transabdominal examination, although measured up to 9.3 cm on CT. The left ureter is not clearly visualized. Other findings: Small amount of free pelvic fluid. Examination limited by pelvic bowel gas and the absence of transvaginal imaging (patient declined the transvaginal study). IMPRESSION: 1. Although suboptimally evaluated on transabdominal imaging, there are tubular hypoechoic structures in the adnexa bilaterally as seen on preceding CT, consistent with bilateral hydrosalpinx or pyosalpinx (pyosalpinx favored by CT appearance and the patient's leukocytosis). Correlate clinically. 2. The ovaries were not specifically visualized. The uterus appears unremarkable. 3. Small amount of free pelvic fluid. Electronically Signed   By: Carey Bullocks M.D.   On: 02/11/2023 15:08   CT ABDOMEN PELVIS W CONTRAST  Result Date: 02/11/2023 CLINICAL DATA:  Abdominal pain EXAM: CT ABDOMEN AND PELVIS WITH CONTRAST TECHNIQUE: Multidetector CT imaging of the abdomen and pelvis was performed using the standard protocol following bolus administration of intravenous contrast. RADIATION DOSE REDUCTION: This exam was performed according to the departmental dose-optimization program which includes automated exposure control, adjustment of the mA and/or kV according to patient size and/or use of iterative reconstruction technique. CONTRAST:  OMNIPAQUE IOHEXOL 300 MG/ML  SOLN COMPARISON:  CT abdomen and pelvis dated January 29, 2019 FINDINGS: Lower chest: No acute abnormality. Hepatobiliary: No focal liver abnormality is seen. No gallstones, gallbladder wall thickening, or biliary dilatation. Pancreas: Unremarkable. No pancreatic ductal dilatation or surrounding inflammatory changes. Spleen: Unchanged mild splenomegaly. Adrenals/Urinary Tract: Bilateral adrenal glands are unremarkable. Mild heterogeneous  enhancement of the left-greater-than-right kidneys. Marked bladder wall thickening and perivesicular  stranding. Stomach/Bowel: Stomach is within normal limits. Appendix appears normal. No evidence of bowel wall thickening, distention, or inflammatory changes. Vascular/Lymphatic: No significant vascular findings are present. No enlarged abdominal or pelvic lymph nodes. Reproductive: Edematous appearance of the uterus with adjacent stranding. Tubular fluid-filled structures of the bilateral adnexa with a more multiloculated appearance on the left. Pelvic fat stranding. Other: No abdominal wall hernia or abnormality. Small volume pelvic free fluid. Musculoskeletal: No acute or significant osseous findings. IMPRESSION: 1. Tubular fluid-filled structures of the bilateral adnexa, concerning for pyosalpinx. Recommend further evaluation with pelvic ultrasound. 2. Marked bladder wall thickening with perivesicular fat stranding, consistent with cystitis. 3. Mild heterogeneous enhancement of the left-greater-than-right kidneys, concerning for associated ascending infection. 4. Mild wall thickening of several small and large bowel loops in the lower abdomen, likely reactive. 5. Small volume pelvic free fluid. Electronically Signed   By: Allegra Lai M.D.   On: 02/11/2023 13:03     Hospital Course: Admitted with PID. Got IV Abx. Improved a lot and was sent home with po Abx.   Disposition: Discharge disposition: 01-Home or Self Care       Discharged Condition: good  Discharge Instructions     Call MD for:  persistant nausea and vomiting   Complete by: As directed    Call MD for:  severe uncontrolled pain   Complete by: As directed    Call MD for:  temperature >100.4   Complete by: As directed    Diet - low sodium heart healthy   Complete by: As directed    Increase activity slowly   Complete by: As directed       Allergies as of 02/14/2023   No Known Allergies      Medication List     TAKE these medications    albuterol 108 (90 Base) MCG/ACT inhaler Commonly known as: VENTOLIN  HFA Inhale 2 puffs into the lungs every 6 (six) hours as needed for wheezing or shortness of breath. Shortness of breath/wheezing   doxycycline 100 MG capsule Commonly known as: VIBRAMYCIN Take 1 capsule (100 mg total) by mouth 2 (two) times daily for 14 days.   metroNIDAZOLE 500 MG tablet Commonly known as: FLAGYL Take 1 tablet (500 mg total) by mouth 2 (two) times daily for 14 days.        Follow-up Information     [facility] Schedule an appointment as soon as possible for a visit.   Why: to establish primary care provider Contact information: [address] [place] [address] [phone]        [facility] Follow up.   Specialty: Obstetrics and Gynecology Why: Hospital follow-up, they will call you with an appointment Contact information: [address] [place] [address] [phone]                Signed: Reva Bores 02/14/2023, 11:40 AM

## 2023-02-14 NOTE — TOC Transition Note (Signed)
Transition of Care [facility]) - CM/SW Discharge Note   Patient Details  Name: Sara Gill MRN: [id number] Date of Birth: [birth year]  Transition of Care [facility]) CM/SW Contact:  Ronny Bacon, RN Phone Number: 02/14/2023, 12:16 PM   Clinical Narrative:   Patient being discharged home today. Patient confirms being uninsured, match assistance of prescriptions completed and program explained to patient. Match letter given to floor nurse to give to patient at discharge.     Final next level of care: Home/Self Care Barriers to Discharge: No Barriers Identified   Patient Goals and CMS Choice      Discharge Placement                         Discharge Plan and Services Additional resources added to the After Visit Summary for                                       Social Determinants of Health (SDOH) Interventions SDOH Screenings   Food Insecurity: No Food Insecurity (02/11/2023)  Housing: Low Risk  (02/11/2023)  Transportation Needs: No Transportation Needs (02/11/2023)  Utilities: Not At Risk (02/11/2023)  Tobacco Use: High Risk (02/11/2023)     Readmission Risk Interventions     No data to display

## 2023-03-04 ENCOUNTER — Ambulatory Visit (INDEPENDENT_AMBULATORY_CARE_PROVIDER_SITE_OTHER): Payer: Self-pay | Admitting: Obstetrics & Gynecology

## 2023-03-04 ENCOUNTER — Other Ambulatory Visit (HOSPITAL_COMMUNITY)
Admission: RE | Admit: 2023-03-04 | Discharge: 2023-03-04 | Disposition: A | Discharge status: Home / Self Care | Payer: Medicaid Other | Source: Ambulatory Visit | Attending: Obstetrics & Gynecology | Admitting: Obstetrics & Gynecology

## 2023-03-04 ENCOUNTER — Encounter: Payer: Self-pay | Admitting: Obstetrics & Gynecology

## 2023-03-04 VITALS — BP 126/78 | HR 81 | Ht 68.0 in | Wt 214.0 lb

## 2023-03-04 DIAGNOSIS — Z124 Encounter for screening for malignant neoplasm of cervix: Secondary | ICD-10-CM

## 2023-03-04 DIAGNOSIS — N730 Acute parametritis and pelvic cellulitis: Secondary | ICD-10-CM

## 2023-03-04 DIAGNOSIS — N7093 Salpingitis and oophoritis, unspecified: Secondary | ICD-10-CM

## 2023-03-04 NOTE — Progress Notes (Signed)
Follow up appointment :  hospitalization  Chief Complaint  Patient presents with   Follow-up    Hospital for PID. Need pap    Blood pressure 126/78, pulse 81, height 5\' 8"  (1.727 m), weight 214 lb (97.1 kg), last menstrual period 02/11/2023, unknown if currently breastfeeding.  [id number]   Admitted 6/20 -6/23 for PID--> bilateral pyosalpinx Cultures negative Symptoms have resolved Discussed findings and likelihood of need tubes removed in the future Understands and will follow up here as needed or 1 year US Pelvis Complete  Result Date: 02/11/2023 CLINICAL DATA:  27 year old with low abdominal and pelvic pain. Abnormal CT suspicious for bilateral pyosalpinx. EXAM: TRANSABDOMINAL ULTRASOUND OF PELVIS TECHNIQUE: Transabdominal ultrasound examination of the pelvis was performed including evaluation of the uterus, ovaries, adnexal regions, and pelvic cul-de-sac. COMPARISON:  Abdominopelvic CT 02/11/2023 and 02/28/2019. FINDINGS: Uterus Measurements: 9.1 x 4.3 x 4.6 cm = volume: 94.7 mL. No fibroids or other mass visualized. Endometrium Thickness: 6 mm. No focal abnormality identified on limited transabdominal imaging. Right ovary Tubular hypoechoic structure noted in the right adnexa as seen on preceding CT, consistent with a hydrosalpinx or pyosalpinx. This was not measured on this transabdominal examination, although measured up to 11.8 cm on the earlier CT. The right ovary is not clearly visualized. Left ovary Tubular hypoechoic structure noted in the left adnexa as seen on previous CT, consistent with a hydrosalpinx or pyosalpinx. This was not measured on this transabdominal examination, although measured up to 9.3 cm on CT. The left ureter is not clearly visualized. Other findings: Small amount of free pelvic fluid. Examination limited by pelvic bowel gas and the absence of transvaginal imaging (patient declined the transvaginal study). IMPRESSION: 1. Although suboptimally evaluated on  transabdominal imaging, there are tubular hypoechoic structures in the adnexa bilaterally as seen on preceding CT, consistent with bilateral hydrosalpinx or pyosalpinx (pyosalpinx favored by CT appearance and the patient's leukocytosis). Correlate clinically. 2. The ovaries were not specifically visualized. The uterus appears unremarkable. 3. Small amount of free pelvic fluid. Electronically Signed   By: Carey Bullocks M.D.   On: 02/11/2023 15:08   CT ABDOMEN PELVIS W CONTRAST  Result Date: 02/11/2023 CLINICAL DATA:  Abdominal pain EXAM: CT ABDOMEN AND PELVIS WITH CONTRAST TECHNIQUE: Multidetector CT imaging of the abdomen and pelvis was performed using the standard protocol following bolus administration of intravenous contrast. RADIATION DOSE REDUCTION: This exam was performed according to the departmental dose-optimization program which includes automated exposure control, adjustment of the mA and/or kV according to patient size and/or use of iterative reconstruction technique. CONTRAST:  OMNIPAQUE IOHEXOL 300 MG/ML  SOLN COMPARISON:  CT abdomen and pelvis dated January 29, 2019 FINDINGS: Lower chest: No acute abnormality. Hepatobiliary: No focal liver abnormality is seen. No gallstones, gallbladder wall thickening, or biliary dilatation. Pancreas: Unremarkable. No pancreatic ductal dilatation or surrounding inflammatory changes. Spleen: Unchanged mild splenomegaly. Adrenals/Urinary Tract: Bilateral adrenal glands are unremarkable. Mild heterogeneous enhancement of the left-greater-than-right kidneys. Marked bladder wall thickening and perivesicular stranding. Stomach/Bowel: Stomach is within normal limits. Appendix appears normal. No evidence of bowel wall thickening, distention, or inflammatory changes. Vascular/Lymphatic: No significant vascular findings are present. No enlarged abdominal or pelvic lymph nodes. Reproductive: Edematous appearance of the uterus with adjacent stranding. Tubular  fluid-filled structures of the bilateral adnexa with a more multiloculated appearance on the left. Pelvic fat stranding. Other: No abdominal wall hernia or abnormality. Small volume pelvic free fluid. Musculoskeletal: No acute or significant osseous findings. IMPRESSION: 1. Tubular fluid-filled structures of  the bilateral adnexa, concerning for pyosalpinx. Recommend further evaluation with pelvic ultrasound. 2. Marked bladder wall thickening with perivesicular fat stranding, consistent with cystitis. 3. Mild heterogeneous enhancement of the left-greater-than-right kidneys, concerning for associated ascending infection. 4. Mild wall thickening of several small and large bowel loops in the lower abdomen, likely reactive. 5. Small volume pelvic free fluid. Electronically Signed   By: Allegra Lai M.D.   On: 02/11/2023 13:03       MEDS ordered this encounter: No orders of the defined types were placed in this encounter.   Orders for this encounter: No orders of the defined types were placed in this encounter.   Impression + Management Plan   ICD-10-CM   1. 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 PID (acute pelvic inflammatory disease)  N73.0     3. Routine Papanicolaou smear  Z12.4 Cytology - PAP( [place])      Follow Up: Return in about 1 year (around 03/03/2024), or if symptoms worsen or fail to improve, for recheck .     All questions were answered.  Past Medical History:  Diagnosis Date   Asthma     History reviewed. No pertinent surgical history.  OB History     Gravida  1   Para  1   Term  1   Preterm  0   AB  0   Living  1      SAB  0   IAB  0   Ectopic  0   Multiple      Live Births              No Known Allergies  Social History   Socioeconomic History   Marital status: Single     Spouse name: Not on file   Number of children: Not on file   Years of education: Not on file   Highest education level: Not on file  Occupational History   Not on file  Tobacco Use   Smoking status: Every Day    Current packs/day: 0.50    Average packs/day: 0.5 packs/day for 10.0 years (5.0 ttl pk-yrs)    Types: Cigarettes   Smokeless tobacco: Never  Vaping Use   Vaping status: Never Used  Substance and Sexual Activity   Alcohol use: Never   Drug use: Not Currently    Types: Marijuana   Sexual activity: Yes    Birth control/protection: None  Other Topics Concern   Not on file  Social History Narrative   ** Merged History Encounter **       Social Determinants of Health   Financial Resource Strain: Not on file  Food Insecurity: No Food Insecurity (02/11/2023)   Hunger Vital Sign    Worried About Running Out of Food in the Last Year: Never true    Ran Out of Food in the Last Year: Never true  Transportation Needs: No Transportation Needs (02/11/2023)   PRAPARE - Administrator, Civil Service (Medical): No    Lack of Transportation (Non-Medical): No  Physical Activity: Not on file  Stress: Not on file  Social Connections: Not on file    Family History  Problem Relation Age of Onset   Healthy Mother    Healthy Father

## 2023-03-17 LAB — CYTOLOGY - PAP
Comment: NEGATIVE
Comment: NEGATIVE
Comment: NEGATIVE
Diagnosis: HIGH — AB
HPV 16: NEGATIVE
HPV 18 / 45: NEGATIVE
High risk HPV: POSITIVE — AB

## 2023-04-01 ENCOUNTER — Encounter: Payer: Self-pay | Admitting: *Deleted

## 2023-04-01 ENCOUNTER — Telehealth: Payer: Self-pay | Admitting: *Deleted

## 2023-04-01 NOTE — Telephone Encounter (Signed)
Patient informed pap smear abnormal. Will need coplo. All questions answered.  Pt states she has not had a period in a month and was told she could not get pregnant.  Advised to take a pregnancy test and if positive, let us know. Verbalized understanding and appt made.

## 2023-04-01 NOTE — Telephone Encounter (Signed)
LMOVM requesting patient call back in regards to pap smear result.

## 2023-04-13 ENCOUNTER — Telehealth: Payer: Self-pay

## 2023-04-13 NOTE — Telephone Encounter (Signed)
LVM- Asking pt to call back, informed pt that there is a program for [facility] residents call [facility] that can provide medical attention.

## 2023-04-27 ENCOUNTER — Encounter: Payer: Medicaid Other | Admitting: Obstetrics & Gynecology

## 2023-05-13 ENCOUNTER — Encounter: Payer: Medicaid Other | Admitting: Obstetrics & Gynecology

## 2023-10-11 ENCOUNTER — Emergency Department (HOSPITAL_COMMUNITY)
Admission: EM | Admit: 2023-10-11 | Discharge: 2023-10-11 | Discharge status: Against Medical Advice | Payer: Medicaid Other | Source: Home / Self Care

## 2023-12-22 ENCOUNTER — Emergency Department (HOSPITAL_COMMUNITY): Payer: Self-pay

## 2023-12-22 ENCOUNTER — Emergency Department (HOSPITAL_COMMUNITY)
Admission: EM | Admit: 2023-12-22 | Discharge: 2023-12-22 | Disposition: A | Discharge status: Home / Self Care | Payer: Self-pay | Attending: Emergency Medicine | Admitting: Emergency Medicine

## 2023-12-22 ENCOUNTER — Encounter (HOSPITAL_COMMUNITY): Payer: Self-pay

## 2023-12-22 ENCOUNTER — Other Ambulatory Visit: Payer: Self-pay

## 2023-12-22 DIAGNOSIS — J45909 Unspecified asthma, uncomplicated: Secondary | ICD-10-CM | POA: Insufficient documentation

## 2023-12-22 DIAGNOSIS — D72829 Elevated white blood cell count, unspecified: Secondary | ICD-10-CM | POA: Insufficient documentation

## 2023-12-22 DIAGNOSIS — N7011 Chronic salpingitis: Secondary | ICD-10-CM | POA: Insufficient documentation

## 2023-12-22 LAB — COMPREHENSIVE METABOLIC PANEL WITH GFR
ALT: 17 U/L (ref 0–44)
AST: 15 U/L (ref 15–41)
Albumin: 3.6 g/dL (ref 3.5–5.0)
Alkaline Phosphatase: 76 U/L (ref 38–126)
Anion gap: 11 (ref 5–15)
BUN: 5 mg/dL — ABNORMAL LOW (ref 6–20)
CO2: 22 mmol/L (ref 22–32)
Calcium: 9.2 mg/dL (ref 8.9–10.3)
Chloride: 101 mmol/L (ref 98–111)
Creatinine, Ser: 0.55 mg/dL (ref 0.44–1.00)
GFR, Estimated: >60 mL/min (ref >60)
Glucose, Bld: 103 mg/dL — ABNORMAL HIGH (ref 70–99)
Potassium: 3.6 mmol/L (ref 3.5–5.1)
Sodium: 134 mmol/L — ABNORMAL LOW (ref 135–145)
Total Bilirubin: 0.3 mg/dL (ref 0.0–1.2)
Total Protein: 8.1 g/dL (ref 6.5–8.1)

## 2023-12-22 LAB — CBC WITH DIFFERENTIAL/PLATELET
Abs Immature Granulocytes: 0.08 10*3/uL — ABNORMAL HIGH (ref 0.00–0.07)
Basophils Absolute: 0.1 10*3/uL (ref 0.0–0.1)
Basophils Relative: 0 %
Eosinophils Absolute: 0.1 10*3/uL (ref 0.0–0.5)
Eosinophils Relative: 1 %
HCT: 34 % — ABNORMAL LOW (ref 36.0–46.0)
Hemoglobin: 10.5 g/dL — ABNORMAL LOW (ref 12.0–15.0)
Immature Granulocytes: 1 %
Lymphocytes Relative: 12 %
Lymphs Abs: 1.7 10*3/uL (ref 0.7–4.0)
MCH: 26.4 pg (ref 26.0–34.0)
MCHC: 30.9 g/dL (ref 30.0–36.0)
MCV: 85.6 fL (ref 80.0–100.0)
Monocytes Absolute: 1 10*3/uL (ref 0.1–1.0)
Monocytes Relative: 7 %
Neutro Abs: 11.7 10*3/uL — ABNORMAL HIGH (ref 1.7–7.7)
Neutrophils Relative %: 79 %
Platelets: 208 10*3/uL (ref 150–400)
RBC: 3.97 MIL/uL (ref 3.87–5.11)
RDW: 17.9 % — ABNORMAL HIGH (ref 11.5–15.5)
WBC: 14.6 10*3/uL — ABNORMAL HIGH (ref 4.0–10.5)
nRBC: 0 % (ref 0.0–0.2)

## 2023-12-22 LAB — URINALYSIS, ROUTINE W REFLEX MICROSCOPIC
Bacteria, UA: NONE SEEN
Bilirubin Urine: NEGATIVE
Glucose, UA: NEGATIVE mg/dL
Ketones, ur: NEGATIVE mg/dL
Nitrite: NEGATIVE
Protein, ur: 100 mg/dL — AB
RBC / HPF: >50 RBC/hpf (ref 0–5)
Specific Gravity, Urine: 1.02 (ref 1.005–1.030)
pH: 7 (ref 5.0–8.0)

## 2023-12-22 LAB — PREGNANCY, URINE: Preg Test, Ur: NEGATIVE

## 2023-12-22 LAB — LIPASE, BLOOD: Lipase: 29 U/L (ref 11–51)

## 2023-12-22 LAB — WET PREP, GENITAL
Clue Cells Wet Prep HPF POC: NONE SEEN
Sperm: NONE SEEN
Trich, Wet Prep: NONE SEEN
WBC, Wet Prep HPF POC: <10 (ref <10)
Yeast Wet Prep HPF POC: NONE SEEN

## 2023-12-22 MED ORDER — DOXYCYCLINE HYCLATE 100 MG PO CAPS: 100.0000 mg | ORAL_CAPSULE | Freq: Two times a day (BID) | ORAL | 0 refills | Status: AC

## 2023-12-22 MED ORDER — HYDROCODONE-ACETAMINOPHEN 5-325 MG PO TABS
1.0000 | ORAL_TABLET | Freq: Once | ORAL | Status: AC
  Administered 2023-12-22: 1 via ORAL
  Filled 2023-12-22: qty 1

## 2023-12-22 MED ORDER — SODIUM CHLORIDE 0.9 % IV SOLN
1.0000 g | Freq: Once | INTRAVENOUS | Status: AC
  Administered 2023-12-22: 1 g via INTRAVENOUS
  Filled 2023-12-22: qty 10

## 2023-12-22 MED ORDER — METRONIDAZOLE 500 MG PO TABS: 500.0000 mg | ORAL_TABLET | Freq: Two times a day (BID) | ORAL | 0 refills | Status: AC

## 2023-12-22 NOTE — ED Notes (Signed)
 Pt tearful and requesting pain med. PA aware.

## 2023-12-22 NOTE — ED Notes (Signed)
 [place] in progress

## 2023-12-22 NOTE — ED Notes (Signed)
 PA and NT performing pelvic exam at this time. Pt has ambulated to bathroom. Nad.

## 2023-12-22 NOTE — ED Notes (Signed)
 See triage notes. Denies vaginal d/c. C/o pain with intercourse this past Friday as well.

## 2023-12-22 NOTE — Discharge Instructions (Signed)
 Take the entire course of the antibiotics prescribed.  [facility] to [facility] for [facility] within the next week, but plan to get seen immediately, perhaps by returning here if you develop fever, worse pain or vomiting.

## 2023-12-22 NOTE — ED Provider Notes (Signed)
 [facility] AT [facility] Provider Note   CSN: [id number] Arrival date & time: 12/22/23  1221     History  Chief Complaint  Patient presents with   Abdominal Pain    Sara Gill is a 28 y.o. female with a history significant for asthma, pyosalpinx last June under the care of Dr. Randolm Butte, presenting with low pelvic pain right greater than left with radiation into her lower back along with a heavier than normal menstrual cycle which was a week later than her normal cycle.  Her period started 3 days ago, she is having to change her pad approximately every 2 hours since yesterday.  She had similar symptoms with her pyosalpinx last June which required hospitalization.  She denies fevers, she denies vaginal discharge, she denies risk for STDs, reports has been with the same partner for years.  She does state her pain worsens with bowel movements, but denies diarrhea or constipation, also denies dysuria symptoms.  She has taken Aleve without pain improvement.  Denies fevers, n/v.  The history is provided by the patient.       Home Medications Prior to Admission medications   Medication Sig Start Date End Date Taking? Authorizing Provider  doxycycline  (VIBRAMYCIN ) 100 MG capsule Take 1 capsule (100 mg total) by mouth 2 (two) times daily. 12/22/23  Yes Caison Hearn, PA-C  metroNIDAZOLE  (FLAGYL ) 500 MG tablet Take 1 tablet (500 mg total) by mouth 2 (two) times daily. 12/22/23  Yes Madolin Twaddle, Concha Deed, PA-C  albuterol  (PROVENTIL  HFA;VENTOLIN  HFA) 108 (90 Base) MCG/ACT inhaler Inhale 2 puffs into the lungs every 6 (six) hours as needed for wheezing or shortness of breath. Shortness of breath/wheezing 08/20/18   Arn Lane, MD      Allergies    Patient has no known allergies.    Review of Systems   Review of Systems  Constitutional:  Negative for chills and fever.  HENT:  Negative for congestion and sore throat.   Eyes: Negative.   Respiratory:  Negative for  chest tightness and shortness of breath.   Cardiovascular:  Negative for chest pain.  Gastrointestinal:  Negative for abdominal pain, nausea and vomiting.  Genitourinary:  Positive for pelvic pain and vaginal bleeding. Negative for dysuria, flank pain and vaginal discharge.  Musculoskeletal:  Positive for back pain. Negative for arthralgias, joint swelling and neck pain.  Skin: Negative.  Negative for rash and wound.  Neurological:  Negative for dizziness, weakness, light-headedness, numbness and headaches.  Psychiatric/Behavioral: Negative.      Physical Exam Updated Vital Signs BP 120/70   Pulse 80   Temp 98.7 F (37.1 C)   Resp 17   Ht 5\' 8"  (1.727 m)   Wt 97.1 kg   LMP 12/17/2023 (Exact Date)   SpO2 97%   BMI 32.55 kg/m  Physical Exam Vitals and nursing note reviewed. Exam conducted with a chaperone present.  Constitutional:      Appearance: She is well-developed.  HENT:     Head: Normocephalic and atraumatic.  Eyes:     Conjunctiva/sclera: Conjunctivae normal.  Cardiovascular:     Rate and Rhythm: Normal rate and regular rhythm.     Heart sounds: Normal heart sounds.  Pulmonary:     Effort: Pulmonary effort is normal.     Breath sounds: Normal breath sounds. No wheezing.  Abdominal:     General: Bowel sounds are normal.     Palpations: Abdomen is soft.     Tenderness: There is  no abdominal tenderness.  Genitourinary:    Vagina: Normal.     Cervix: Cervical bleeding present. No cervical motion tenderness or discharge.     Uterus: Normal. Not tender.      Adnexa:        Right: Tenderness present.      Comments: Small amount of blood in vagina. Musculoskeletal:        General: Normal range of motion.     Cervical back: Normal range of motion.  Skin:    General: Skin is warm and dry.  Neurological:     Mental Status: She is alert.     ED Results / Procedures / Treatments   Labs (all labs ordered are listed, but only abnormal results are displayed) Labs  Reviewed  URINALYSIS, ROUTINE W REFLEX MICROSCOPIC - Abnormal; Notable for the following components:      Result Value   Color, Urine RED (*)    APPearance CLOUDY (*)    Hgb urine dipstick LARGE (*)    Protein, ur 100 (*)    Leukocytes,Ua MODERATE (*)    All other components within normal limits  COMPREHENSIVE METABOLIC PANEL WITH GFR - Abnormal; Notable for the following components:   Sodium 134 (*)    Glucose, Bld 103 (*)    BUN 5 (*)    All other components within normal limits  CBC WITH DIFFERENTIAL/PLATELET - Abnormal; Notable for the following components:   WBC 14.6 (*)    Hemoglobin 10.5 (*)    HCT 34.0 (*)    RDW 17.9 (*)    Neutro Abs 11.7 (*)    Abs Immature Granulocytes 0.08 (*)    All other components within normal limits  WET PREP, GENITAL  PREGNANCY, URINE  LIPASE, BLOOD  GC/CHLAMYDIA PROBE AMP ([place]) NOT AT [facility]    EKG None  Radiology US  PELVIS (TRANSABDOMINAL ONLY) Addendum Date: 12/22/2023 ADDENDUM REPORT: 12/22/2023 17:32 ADDENDUM: Upon further review and after discussion with the sonographer, left ovary is not confidently visualized. Anechoic structure in the left adnexa may represent a portion of the right hydrosalpinx or free fluid. Electronically Signed   By: Shearon Denis M.D.   On: 12/22/2023 17:32   Result Date: 12/22/2023 CLINICAL DATA:  Pelvic pain for a week. EXAM: TRANSABDOMINAL ULTRASOUND OF PELVIS TECHNIQUE: Transabdominal ultrasound examination of the pelvis was performed including evaluation of the uterus, ovaries, adnexal regions, and pelvic cul-de-sac. COMPARISON:  02/11/2023. FINDINGS: Uterus Measurements: 8.3 x 4.2 x 5.0 cm = volume: 91.0 mL. No fibroids or other mass visualized. Endometrium Thickness: 4 mm, within normal limits. No focal abnormality visualized. Right ovary Measurements: 3.7 x 2.3 x 3.0 cm = volume: 13.6 mL. Anechoic tubular structure adjacent to the right ovary measures 9.6 x 2.8 x 3.7 cm. Right ovary is otherwise  unremarkable. Left ovary Measurements: 3.3 x 2.6 x 3.2 cm = volume: 14.2 mL. Probable dominant anechoic follicle in the left ovary measuring 2.6 x 1.9 x 2.6 cm. Other findings:  Small pelvic free fluid. IMPRESSION: 1. Right hydrosalpinx. 2. Small pelvic free fluid. Electronically Signed: By: Shearon Denis M.D. On: 12/22/2023 17:15    Procedures Procedures    Medications Ordered in ED Medications  HYDROcodone -acetaminophen  (NORCO/VICODIN) 5-325 MG per tablet 1 tablet (1 tablet Oral Given 12/22/23 1641)  cefTRIAXone  (ROCEPHIN ) 1 g in sodium chloride  0.9 % 100 mL IVPB (0 g Intravenous Stopped 12/22/23 1912)    ED Course/ Medical Decision Making/ A&P  Medical Decision Making Pt presenting with right lower pelvic pain similar to sx from last summer , admission for pyosalpinx, was advised will need salpingectomy as will continue to develop infections, has not followed up yet.  No fever, no vomiting.  No acute abd on serial exams, no CMT but ttp right adnexal region.    Amount and/or Complexity of Data Reviewed Labs: ordered.    Details: Gc/chlamydia cx pending,  wet prep neg. UA, u preg neg.  Leukocytosis wbc count 14.6, hgb 10.5  hematuria, but clean catch, pt menstruating,  Radiology: ordered.    Details: US  reviewed, hydrosalpinx. Discussion of management or test interpretation with external provider(s): Discussed with Dr. Barry Boring with gyn - if no fever or emesis, can tx outpt with flagyl  and doxycycline , with IV dose of rocephin  prior to dc. Strict return precautions and f/u with [facility] promptly.  Risk Prescription drug management.           Final Clinical Impression(s) / ED Diagnoses Final diagnoses:  Hydrosalpinx    Rx / DC Orders ED Discharge Orders          Ordered    metroNIDAZOLE  (FLAGYL ) 500 MG tablet  2 times daily        12/22/23 1953    doxycycline  (VIBRAMYCIN ) 100 MG capsule  2 times daily        12/22/23 1953               Vianny Schraeder, PA-C 12/23/23 1954    Ninetta Basket, MD 12/28/23 2036

## 2023-12-22 NOTE — ED Triage Notes (Signed)
 Pt arrived via abdominal pain, back pain and heavy menstrual bleeding. Pt reports symptoms feel similar to an episode she experienced last year where she had an infection in her fallopian tubes. Pt reports pain with BM's. Pt reports pain and symptoms began this past Friday.

## 2023-12-23 ENCOUNTER — Encounter (HOSPITAL_COMMUNITY): Payer: Self-pay

## 2023-12-23 ENCOUNTER — Emergency Department (HOSPITAL_COMMUNITY)
Admission: EM | Admit: 2023-12-23 | Discharge: 2023-12-23 | Discharge status: Against Medical Advice | Payer: Self-pay | Attending: Emergency Medicine | Admitting: Emergency Medicine

## 2023-12-23 ENCOUNTER — Other Ambulatory Visit: Payer: Self-pay

## 2023-12-23 DIAGNOSIS — Z5321 Procedure and treatment not carried out due to patient leaving prior to being seen by health care provider: Secondary | ICD-10-CM | POA: Insufficient documentation

## 2023-12-23 DIAGNOSIS — R1030 Lower abdominal pain, unspecified: Secondary | ICD-10-CM | POA: Insufficient documentation

## 2023-12-23 LAB — COMPREHENSIVE METABOLIC PANEL WITH GFR
ALT: 16 U/L (ref 0–44)
AST: 17 U/L (ref 15–41)
Albumin: 3.8 g/dL (ref 3.5–5.0)
Alkaline Phosphatase: 74 U/L (ref 38–126)
Anion gap: 13 (ref 5–15)
BUN: 10 mg/dL (ref 6–20)
CO2: 18 mmol/L — ABNORMAL LOW (ref 22–32)
Calcium: 9.5 mg/dL (ref 8.9–10.3)
Chloride: 101 mmol/L (ref 98–111)
Creatinine, Ser: 0.78 mg/dL (ref 0.44–1.00)
GFR, Estimated: >60 mL/min (ref >60)
Glucose, Bld: 115 mg/dL — ABNORMAL HIGH (ref 70–99)
Potassium: 3.6 mmol/L (ref 3.5–5.1)
Sodium: 132 mmol/L — ABNORMAL LOW (ref 135–145)
Total Bilirubin: 0.4 mg/dL (ref 0.0–1.2)
Total Protein: 8.4 g/dL — ABNORMAL HIGH (ref 6.5–8.1)

## 2023-12-23 LAB — CBC
HCT: 34.3 % — ABNORMAL LOW (ref 36.0–46.0)
Hemoglobin: 10.4 g/dL — ABNORMAL LOW (ref 12.0–15.0)
MCH: 25.6 pg — ABNORMAL LOW (ref 26.0–34.0)
MCHC: 30.3 g/dL (ref 30.0–36.0)
MCV: 84.3 fL (ref 80.0–100.0)
Platelets: 244 10*3/uL (ref 150–400)
RBC: 4.07 MIL/uL (ref 3.87–5.11)
RDW: 17.6 % — ABNORMAL HIGH (ref 11.5–15.5)
WBC: 14.7 10*3/uL — ABNORMAL HIGH (ref 4.0–10.5)
nRBC: 0 % (ref 0.0–0.2)

## 2023-12-23 LAB — LIPASE, BLOOD: Lipase: 26 U/L (ref 11–51)

## 2023-12-23 LAB — GC/CHLAMYDIA PROBE AMP (~~LOC~~) NOT AT ARMC
Chlamydia: NEGATIVE
Comment: NEGATIVE
Comment: NORMAL
Neisseria Gonorrhea: NEGATIVE

## 2023-12-23 NOTE — ED Triage Notes (Signed)
 Pt presents with lower abdominal pain that radiates to back x2 hours. Pt was seen here yesterday and was told she had fluid in fallopian tubes. Menstrual bleeding and is 5th day with heavy bleeding.

## 2024-01-13 ENCOUNTER — Ambulatory Visit: Admitting: Obstetrics & Gynecology
